# Patient Record
Sex: Female | Born: 1960 | Race: Black or African American | Hispanic: No | Marital: Single | Smoking: Never smoker
Health system: Southern US, Community
[De-identification: ages and names within clinical notes are randomized; demographics above are authoritative.]

## PROBLEM LIST (undated history)

## (undated) DIAGNOSIS — I10 Essential (primary) hypertension: Secondary | ICD-10-CM

## (undated) DIAGNOSIS — IMO0002 Reserved for concepts with insufficient information to code with codable children: Secondary | ICD-10-CM

## (undated) DIAGNOSIS — E785 Hyperlipidemia, unspecified: Secondary | ICD-10-CM

## (undated) HISTORY — PX: LIPOMA EXCISION: SHX5283

## (undated) HISTORY — DX: Reserved for concepts with insufficient information to code with codable children: IMO0002

## (undated) HISTORY — DX: Hyperlipidemia, unspecified: E78.5

## (undated) HISTORY — DX: Essential (primary) hypertension: I10

## (undated) HISTORY — PX: OTHER SURGICAL HISTORY: SHX169

---

## 1998-02-12 ENCOUNTER — Emergency Department (HOSPITAL_COMMUNITY): Admission: EM | Admit: 1998-02-12 | Discharge: 1998-02-12 | Payer: Self-pay | Admitting: Emergency Medicine

## 1998-03-13 ENCOUNTER — Emergency Department (HOSPITAL_COMMUNITY): Admission: EM | Admit: 1998-03-13 | Discharge: 1998-03-13 | Payer: Self-pay | Admitting: Emergency Medicine

## 1998-10-23 HISTORY — PX: ABDOMINAL HYSTERECTOMY: SHX81

## 2010-11-14 ENCOUNTER — Encounter
Admission: RE | Admit: 2010-11-14 | Discharge: 2010-11-22 | Payer: Self-pay | Source: Home / Self Care | Attending: Orthopedic Surgery | Admitting: Orthopedic Surgery

## 2010-11-24 ENCOUNTER — Ambulatory Visit: Payer: Worker's Compensation | Attending: Orthopedic Surgery | Admitting: Physical Therapy

## 2010-11-24 DIAGNOSIS — M2569 Stiffness of other specified joint, not elsewhere classified: Secondary | ICD-10-CM | POA: Insufficient documentation

## 2010-11-24 DIAGNOSIS — M25519 Pain in unspecified shoulder: Secondary | ICD-10-CM | POA: Insufficient documentation

## 2010-11-24 DIAGNOSIS — M542 Cervicalgia: Secondary | ICD-10-CM | POA: Insufficient documentation

## 2010-11-24 DIAGNOSIS — IMO0001 Reserved for inherently not codable concepts without codable children: Secondary | ICD-10-CM | POA: Insufficient documentation

## 2010-11-29 ENCOUNTER — Ambulatory Visit: Payer: Worker's Compensation | Admitting: Physical Therapy

## 2010-12-01 ENCOUNTER — Ambulatory Visit: Payer: Worker's Compensation | Admitting: Physical Therapy

## 2010-12-05 ENCOUNTER — Ambulatory Visit: Payer: Worker's Compensation | Attending: Orthopedic Surgery | Admitting: Physical Therapy

## 2010-12-05 DIAGNOSIS — M542 Cervicalgia: Secondary | ICD-10-CM | POA: Insufficient documentation

## 2010-12-05 DIAGNOSIS — M2569 Stiffness of other specified joint, not elsewhere classified: Secondary | ICD-10-CM | POA: Insufficient documentation

## 2010-12-05 DIAGNOSIS — M25519 Pain in unspecified shoulder: Secondary | ICD-10-CM | POA: Insufficient documentation

## 2010-12-05 DIAGNOSIS — IMO0001 Reserved for inherently not codable concepts without codable children: Secondary | ICD-10-CM | POA: Insufficient documentation

## 2010-12-08 ENCOUNTER — Ambulatory Visit: Payer: Worker's Compensation | Admitting: Physical Therapy

## 2010-12-13 ENCOUNTER — Ambulatory Visit: Payer: Worker's Compensation | Admitting: Physical Therapy

## 2010-12-15 ENCOUNTER — Ambulatory Visit: Payer: Worker's Compensation | Admitting: Physical Therapy

## 2010-12-20 ENCOUNTER — Ambulatory Visit: Payer: Worker's Compensation | Admitting: Physical Therapy

## 2010-12-22 ENCOUNTER — Ambulatory Visit: Payer: Worker's Compensation | Attending: Orthopedic Surgery | Admitting: Physical Therapy

## 2010-12-22 DIAGNOSIS — M2569 Stiffness of other specified joint, not elsewhere classified: Secondary | ICD-10-CM | POA: Insufficient documentation

## 2010-12-22 DIAGNOSIS — IMO0001 Reserved for inherently not codable concepts without codable children: Secondary | ICD-10-CM | POA: Insufficient documentation

## 2010-12-22 DIAGNOSIS — M25519 Pain in unspecified shoulder: Secondary | ICD-10-CM | POA: Insufficient documentation

## 2010-12-22 DIAGNOSIS — M542 Cervicalgia: Secondary | ICD-10-CM | POA: Insufficient documentation

## 2010-12-28 ENCOUNTER — Ambulatory Visit: Payer: Worker's Compensation | Admitting: Physical Therapy

## 2010-12-30 ENCOUNTER — Ambulatory Visit: Payer: Worker's Compensation | Admitting: Physical Therapy

## 2011-01-03 ENCOUNTER — Ambulatory Visit: Payer: Worker's Compensation | Admitting: Physical Therapy

## 2011-01-05 ENCOUNTER — Ambulatory Visit: Payer: Worker's Compensation | Admitting: Physical Therapy

## 2011-01-09 ENCOUNTER — Ambulatory Visit: Payer: Worker's Compensation | Admitting: Physical Therapy

## 2011-01-12 ENCOUNTER — Ambulatory Visit: Payer: Worker's Compensation | Admitting: Physical Therapy

## 2011-04-17 ENCOUNTER — Encounter (INDEPENDENT_AMBULATORY_CARE_PROVIDER_SITE_OTHER): Payer: Self-pay | Admitting: General Surgery

## 2011-04-17 ENCOUNTER — Ambulatory Visit (INDEPENDENT_AMBULATORY_CARE_PROVIDER_SITE_OTHER): Payer: Managed Care, Other (non HMO) | Admitting: General Surgery

## 2011-04-17 VITALS — BP 116/78 | HR 86 | Temp 96.6°F | Ht 64.0 in | Wt 180.8 lb

## 2011-04-17 DIAGNOSIS — M259 Joint disorder, unspecified: Secondary | ICD-10-CM

## 2011-04-17 DIAGNOSIS — R2230 Localized swelling, mass and lump, unspecified upper limb: Secondary | ICD-10-CM

## 2011-04-17 NOTE — Assessment & Plan Note (Signed)
Remove in OR.  Discussed surgery with patient including risks of bleeding, infection, damage to other structures.  I advised her that I would leave a drain and put her in a shoulder sling for 2 weeks.  She would at least be out of work for 2 weeks or whenever drain is removed, whichever is later.  She was advised that the scar would be 10-14 cm long and obliquely oriented.  I discussed packing of wound in case of infection.

## 2011-04-17 NOTE — Progress Notes (Signed)
Subjective:     Patient ID: Shirley Hubbard, female   DOB: September 02, 1961, 50 y.o.   MRN: [id number]    BP 116/78  Pulse 86  Temp 96.6 F (35.9 C)  Ht 5\' 4"  (1.626 m)  Wt 180 lb 12.8 oz (82.01 kg)  BMI 31.03 kg/m2    HPI Shirley Hubbard is a 50 y/o F with a 4-5 year history of a L shoulder mass.  She says it started very small and has gradually been getting larger.  It is now the size of a large orange.  It does sometimes become painful, around 3-4/10.  She does not require analgesics.  It has become red in the past, but has not ever drained or developed a sore.    Review of Systems  All other systems reviewed and are negative.   Past Medical History  Diagnosis Date  . Hypertension     on medication but cant remember the name   Past Surgical History  Procedure Date  . Abdominal hysterectomy 2000   No outpatient encounter prescriptions on file as of 04/17/2011.   No Known Allergies  History   Social History  . Marital Status: Married    Spouse Name: N/A    Number of Children: N/A  . Years of Education: N/A   Occupational History  . Not on file.   Social History Main Topics  . Smoking status: Never Smoker   . Smokeless tobacco: Never Used  . Alcohol Use: No  . Drug Use: No  . Sexually Active: Not on file   Other Topics Concern  . Not on file   Social History Narrative  . No narrative on file     Objective:   Physical Exam  Nursing note and vitals reviewed. Constitutional: She is oriented to person, place, and time. She appears well-developed and well-nourished. No distress.  HENT:  Head: Normocephalic and atraumatic.  Mouth/Throat: Oropharynx is clear and moist.  Eyes: Conjunctivae and EOM are normal. Pupils are equal, round, and reactive to light. No scleral icterus.  Neck: Normal range of motion. Neck supple. No tracheal deviation present. No thyromegaly present.  Cardiovascular: Normal rate, regular rhythm and normal heart sounds.  Exam reveals no gallop and  no friction rub.   No murmur heard. Pulmonary/Chest: Effort normal and breath sounds normal. No stridor. No respiratory distress. She has no wheezes. She has no rales.   She exhibits no tenderness.       Soft mobile mass with telangiectasias in center, 12 cm  Abdominal: Soft. Bowel sounds are normal. She exhibits no distension and no mass. There is no tenderness. There is no rebound and no guarding.  Musculoskeletal: Normal range of motion. She exhibits no edema and no tenderness.  Lymphadenopathy:    She has no cervical adenopathy.  Neurological: She is alert and oriented to person, place, and time. Coordination normal.  Skin: Skin is warm and dry. No rash noted. No erythema. No pallor.  Psychiatric: She has a normal mood and affect. Her behavior is normal. Judgment and thought content normal.       Assessment:       Plan:

## 2011-05-05 ENCOUNTER — Encounter (HOSPITAL_BASED_OUTPATIENT_CLINIC_OR_DEPARTMENT_OTHER)
Admission: RE | Admit: 2011-05-05 | Discharge: 2011-05-05 | Disposition: A | Discharge status: Home / Self Care | Payer: Managed Care, Other (non HMO) | Source: Ambulatory Visit | Attending: General Surgery | Admitting: General Surgery

## 2011-05-05 LAB — BASIC METABOLIC PANEL WITH GFR
BUN: 18 mg/dL (ref 6–23)
CO2: 29 meq/L (ref 19–32)
Calcium: 9.5 mg/dL (ref 8.4–10.5)
Chloride: 105 meq/L (ref 96–112)
Creatinine, Ser: 0.74 mg/dL (ref 0.50–1.10)
GFR calc Af Amer: >60 mL/min
GFR calc non Af Amer: >60 mL/min
Glucose, Bld: 97 mg/dL (ref 70–99)
Potassium: 3.8 meq/L (ref 3.5–5.1)
Sodium: 141 meq/L (ref 135–145)

## 2011-05-08 ENCOUNTER — Other Ambulatory Visit (INDEPENDENT_AMBULATORY_CARE_PROVIDER_SITE_OTHER): Payer: Self-pay | Admitting: General Surgery

## 2011-05-08 ENCOUNTER — Ambulatory Visit (HOSPITAL_BASED_OUTPATIENT_CLINIC_OR_DEPARTMENT_OTHER)
Admission: RE | Admit: 2011-05-08 | Discharge: 2011-05-08 | Disposition: A | Discharge status: Home / Self Care | Payer: Managed Care, Other (non HMO) | Source: Ambulatory Visit | Attending: General Surgery | Admitting: General Surgery

## 2011-05-08 DIAGNOSIS — Z01812 Encounter for preprocedural laboratory examination: Secondary | ICD-10-CM | POA: Insufficient documentation

## 2011-05-08 DIAGNOSIS — D1739 Benign lipomatous neoplasm of skin and subcutaneous tissue of other sites: Secondary | ICD-10-CM

## 2011-05-08 DIAGNOSIS — Z01810 Encounter for preprocedural cardiovascular examination: Secondary | ICD-10-CM | POA: Insufficient documentation

## 2011-05-08 LAB — POCT HEMOGLOBIN-HEMACUE: Hemoglobin: 11 g/dL — ABNORMAL LOW (ref 12.0–15.0)

## 2011-05-10 NOTE — Op Note (Signed)
  NAMEDanton Hubbard               ACCOUNT NO.:  [id number]  MEDICAL RECORD NO.:  [id number]  LOCATION:                                 FACILITY:  PHYSICIAN:  Almond Lint, MD       DATE OF BIRTH:  [birth year]  DATE OF PROCEDURE:  05/08/2011 DATE OF DISCHARGE:                              OPERATIVE REPORT   PREOPERATIVE DIAGNOSIS:  Left back mass.  POSTOPERATIVE DIAGNOSIS:  Left back mass.  PROCEDURE:  Excision of the left back mass, 8 x 13 cm.  SURGEON:  Almond Lint, MD  ASSISTANT:  None.  ANESTHESIA:  General and local.  FINDINGS:  Fatty back mass, 8 x 13 cm.  SPECIMEN:  Left back mass to Pathology.  ESTIMATED BLOOD LOSS:  Minimal.  COMPLICATIONS:  None known.  PROCEDURE:  [name] was identified in the holding area and taken to operating room where she was placed supine on the operating room table. General anesthesia was induced.  She was placed into the right lateral decubitus position and her left back was prepped and draped in sterile fashion.  Time-out was performed according to surgical safety check list.  When all was correct, we continued.  The skin was marked over the mass and this was infiltrated with local anesthetic.  The skin was incised with #10 blade.  Skin hooks were used to elevate both the superior and inferior borders while skin flaps were created around the fatty mass.  This was quite large and did extend down to the fascia. Once the skin flaps have been created to the bottom of the mass, this was reflected off the muscle with the cautery.  The mass was then passed off the table.  It measured 8 x 13 cm.  The skin was noted to be redundant, and the skin flaps right over the top of the mass were quite thin because of how close the mass was to the skin.  The excess skin was excised.  An 22 Blake drain was placed at the lateral aspect and secured with 2-0 nylon.  The area was irrigated and hemostasis was achieved with the cautery.  The skin was  then reapproximated with 3-0 Vicryl deep dermal sutures and 4-0 Monocryl running subcuticular sutures.  This was cleaned, dried, and dressed with Benzoin, Steri-Strips gauze, and Tegaderm.  The patient was awakened from anesthesia and taken to PACU in stable condition.  Needle, sponge, instrument counts were correct.     Almond Lint, MD     FB/MEDQ  D:  05/08/2011  T:  05/08/2011  Job:  [id number]  Electronically Signed by Almond Lint MD on 05/10/2011 02:21:56 PM

## 2011-05-12 ENCOUNTER — Ambulatory Visit (INDEPENDENT_AMBULATORY_CARE_PROVIDER_SITE_OTHER): Payer: Commercial Indemnity | Admitting: General Surgery

## 2011-05-12 ENCOUNTER — Encounter (INDEPENDENT_AMBULATORY_CARE_PROVIDER_SITE_OTHER): Payer: Self-pay | Admitting: General Surgery

## 2011-05-12 VITALS — Temp 97.1°F

## 2011-05-12 DIAGNOSIS — D1779 Benign lipomatous neoplasm of other sites: Secondary | ICD-10-CM

## 2011-05-12 DIAGNOSIS — D171 Benign lipomatous neoplasm of skin and subcutaneous tissue of trunk: Secondary | ICD-10-CM

## 2011-05-12 NOTE — Progress Notes (Signed)
Subjective:     Patient ID: Shirley Hubbard, female   DOB: 08/06/1961, 50 y.o.   MRN: [id number]  HPI Doing Ok after lipoma removal. She is taking around 6 pain pills per day.  They do cause itching and some nausea.  She has been taking it easy.  She denies fevers/chills.  Review of Systems Otherwise negative.    Objective:   Physical Exam Surgical site is without swelling, erythema, or drainage.  Drain is serosanguinous.  Output has been around 20 mL/day.  This is pulled.    Assessment:     L back lipoma    Plan:        Follow up in 2 weeks.

## 2011-05-16 ENCOUNTER — Ambulatory Visit (INDEPENDENT_AMBULATORY_CARE_PROVIDER_SITE_OTHER): Payer: Commercial Indemnity | Admitting: General Surgery

## 2011-05-16 ENCOUNTER — Emergency Department (HOSPITAL_COMMUNITY)
Admission: EM | Admit: 2011-05-16 | Discharge: 2011-05-16 | Disposition: A | Discharge status: Home / Self Care | Payer: Managed Care, Other (non HMO) | Attending: Emergency Medicine | Admitting: Emergency Medicine

## 2011-05-16 ENCOUNTER — Other Ambulatory Visit (INDEPENDENT_AMBULATORY_CARE_PROVIDER_SITE_OTHER): Payer: Self-pay

## 2011-05-16 DIAGNOSIS — T148XXA Other injury of unspecified body region, initial encounter: Secondary | ICD-10-CM

## 2011-05-16 DIAGNOSIS — Z0389 Encounter for observation for other suspected diseases and conditions ruled out: Secondary | ICD-10-CM | POA: Insufficient documentation

## 2011-05-16 DIAGNOSIS — IMO0002 Reserved for concepts with insufficient information to code with codable children: Secondary | ICD-10-CM

## 2011-05-16 MED ORDER — OXYCODONE-ACETAMINOPHEN 7.5-325 MG PO TABS: 1.0000 | ORAL_TABLET | ORAL | Status: DC | PRN

## 2011-05-16 NOTE — Patient Instructions (Signed)
Ice to area for 48 hours then heat.  Use arm sling for comfort.

## 2011-05-16 NOTE — Progress Notes (Signed)
She had her drain removed from her back 4 days ago by Dr. Donell Beers. She will go this morning with severe pain swelling and bruising in the left upper back area around the incision. She's having difficulty moving her left arm due to the pain.  No fever or chills.  Exam: The upper back incision is intact. There is surrounding ecchymosis and firm swelling present. Sterile needle aspiration was performed and dark thick blood was evacuated-a small amount. Ultrasound was performed of the area-heterogeneous fluid collections were noted.  Assessment: Postoperative hematoma.  Plan: Ice to area for 48 hours then start heat. Use arm sling for comfort. Avoid aspirin or nonsteroidal medications. Will refill her Percocet. Keep her return visit with Dr. Donell Beers.

## 2011-05-26 ENCOUNTER — Ambulatory Visit (INDEPENDENT_AMBULATORY_CARE_PROVIDER_SITE_OTHER): Payer: Commercial Indemnity | Admitting: General Surgery

## 2011-05-26 ENCOUNTER — Encounter (INDEPENDENT_AMBULATORY_CARE_PROVIDER_SITE_OTHER): Payer: Self-pay | Admitting: General Surgery

## 2011-05-26 VITALS — HR 68 | Temp 96.0°F

## 2011-05-26 DIAGNOSIS — IMO0002 Reserved for concepts with insufficient information to code with codable children: Secondary | ICD-10-CM

## 2011-05-26 DIAGNOSIS — T148XXA Other injury of unspecified body region, initial encounter: Secondary | ICD-10-CM

## 2011-05-26 MED ORDER — OXYCODONE-ACETAMINOPHEN 7.5-325 MG PO TABS: 1.0000 | ORAL_TABLET | ORAL | Status: DC | PRN

## 2011-05-26 NOTE — Progress Notes (Signed)
Shirley Hubbard is a 50 y.o. female.    Chief Complaint  Patient presents with  . Other    PO 2 wk reck drain    HPI HPI [name] continues to have 7/10 pain.  She is experiencing significant swelling and some limitation of L arm movement.  She is feeling some better since last evaluation.    Past Medical History  Diagnosis Date  . Hypertension     on medication but cant remember the name  . Lipoma     Past Surgical History  Procedure Date  . Abdominal hysterectomy 2000  . Lipoma excision     back    History reviewed. No pertinent family history.  Social History History  Substance Use Topics  . Smoking status: Never Smoker   . Smokeless tobacco: Never Used  . Alcohol Use: No    No Known Allergies  Current Outpatient Prescriptions  Medication Sig Dispense Refill  . lisinopril-hydrochlorothiazide (PRINZIDE,ZESTORETIC) 20-12.5 MG per tablet Take 1 tablet by mouth daily.        Marland Kitchen oxyCODONE-acetaminophen (PERCOCET) 7.5-325 MG per tablet Take 1 tablet by mouth every 4 (four) hours as needed for pain.  30 tablet  0  . PREMARIN 0.3 MG tablet Daily.        Review of Systems ROS  Physical Exam Physical Exam  Constitutional: She is oriented to person, place, and time. She appears well-developed and well-nourished. No distress.  HENT:  Head: Normocephalic and atraumatic.  Eyes: Conjunctivae and EOM are normal. Pupils are equal, round, and reactive to light.  Respiratory: Effort normal and breath sounds normal.         hematoma  GI: Soft.  Musculoskeletal: She exhibits no edema and no tenderness.       Decreased ROM L arm  Neurological: She is alert and oriented to person, place, and time. Coordination normal.  Skin: Skin is warm and dry. She is not diaphoretic.  Psychiatric: She has a normal mood and affect. Her behavior is normal. Judgment and thought content normal.     Pulse 68, temperature 96 F (35.6 C).  Assessment/Plan  Lipoma, s/p  excision.  Hematoma - postoperative Aspirated part of the hematoma for pain reasons Not all would aspirate. Advised that these usually resolve on their own.  Follow up in 2 weeks.    Refill oxycodone.   Constantinos Krempasky 05/26/2011, 10:46 AM

## 2011-05-26 NOTE — Assessment & Plan Note (Signed)
Aspirated part of the hematoma for pain reasons Not all would aspirate. Advised that these usually resolve on their own.  Follow up in 2 weeks.

## 2011-06-08 ENCOUNTER — Encounter (INDEPENDENT_AMBULATORY_CARE_PROVIDER_SITE_OTHER): Payer: Self-pay | Admitting: General Surgery

## 2011-06-09 ENCOUNTER — Encounter (INDEPENDENT_AMBULATORY_CARE_PROVIDER_SITE_OTHER): Payer: Self-pay | Admitting: General Surgery

## 2011-06-09 ENCOUNTER — Ambulatory Visit (INDEPENDENT_AMBULATORY_CARE_PROVIDER_SITE_OTHER): Payer: Commercial Indemnity | Admitting: General Surgery

## 2011-06-09 VITALS — Temp 96.8°F

## 2011-06-09 DIAGNOSIS — T148XXA Other injury of unspecified body region, initial encounter: Secondary | ICD-10-CM

## 2011-06-09 DIAGNOSIS — IMO0002 Reserved for concepts with insufficient information to code with codable children: Secondary | ICD-10-CM

## 2011-06-09 MED ORDER — OXYCODONE-ACETAMINOPHEN 7.5-325 MG PO TABS: 1.0000 | ORAL_TABLET | ORAL | Status: DC | PRN

## 2011-06-09 NOTE — Assessment & Plan Note (Signed)
Hematoma improving, still present, shoulder still tender.   Range of motion improved, but not back to normal. Physical therapy consult.   Refill narcotics.

## 2011-06-09 NOTE — Progress Notes (Signed)
HPI: Doing better from pain standpoint with Left shoulder hematoma after large lipoma removal. No fevers/chills.  Sleeping better.    Exam: L shoulder incision well healed.  Hematoma smaller than last visit. Still sore.  No sign of cellulitis.  Arm abduction to 90 degrees.  Hematoma - postoperative Hematoma improving, still present, shoulder still tender.   Range of motion improved, but not back to normal. Physical therapy consult.   Refill narcotics.

## 2011-06-29 ENCOUNTER — Telehealth (INDEPENDENT_AMBULATORY_CARE_PROVIDER_SITE_OTHER): Payer: Self-pay | Admitting: General Surgery

## 2011-06-29 ENCOUNTER — Ambulatory Visit: Payer: Managed Care, Other (non HMO) | Attending: General Surgery

## 2011-06-29 ENCOUNTER — Ambulatory Visit: Payer: Managed Care, Other (non HMO)

## 2011-06-29 DIAGNOSIS — IMO0001 Reserved for inherently not codable concepts without codable children: Secondary | ICD-10-CM | POA: Insufficient documentation

## 2011-06-29 DIAGNOSIS — M25519 Pain in unspecified shoulder: Secondary | ICD-10-CM | POA: Insufficient documentation

## 2011-06-29 DIAGNOSIS — M25619 Stiffness of unspecified shoulder, not elsewhere classified: Secondary | ICD-10-CM | POA: Insufficient documentation

## 2011-06-29 NOTE — Telephone Encounter (Signed)
The patient contacted the office insisting on a refill for Percocet 7.5-325 MG. Is this medication okay to refill.

## 2011-06-29 NOTE — Telephone Encounter (Signed)
Yes  X 40 pills no refills

## 2011-07-04 ENCOUNTER — Telehealth (INDEPENDENT_AMBULATORY_CARE_PROVIDER_SITE_OTHER): Payer: Self-pay

## 2011-07-04 NOTE — Telephone Encounter (Signed)
Called pt to inquire if physical therapy appt had been scheduled for her yet.

## 2011-07-06 ENCOUNTER — Ambulatory Visit: Payer: Managed Care, Other (non HMO) | Admitting: Physical Therapy

## 2011-07-07 ENCOUNTER — Telehealth (INDEPENDENT_AMBULATORY_CARE_PROVIDER_SITE_OTHER): Payer: Self-pay

## 2011-07-07 NOTE — Telephone Encounter (Signed)
Pt called to let me know she has been scheduled for her physical therapy and has an appointment today for her second session.

## 2011-07-11 ENCOUNTER — Ambulatory Visit: Payer: Managed Care, Other (non HMO) | Admitting: Physical Therapy

## 2011-07-14 ENCOUNTER — Encounter (INDEPENDENT_AMBULATORY_CARE_PROVIDER_SITE_OTHER): Payer: Self-pay

## 2011-07-14 ENCOUNTER — Other Ambulatory Visit (INDEPENDENT_AMBULATORY_CARE_PROVIDER_SITE_OTHER): Payer: Self-pay | Admitting: General Surgery

## 2011-07-14 ENCOUNTER — Encounter (INDEPENDENT_AMBULATORY_CARE_PROVIDER_SITE_OTHER): Payer: Commercial Indemnity | Admitting: General Surgery

## 2011-07-14 ENCOUNTER — Ambulatory Visit: Payer: Managed Care, Other (non HMO) | Admitting: Physical Therapy

## 2011-07-14 DIAGNOSIS — T148XXA Other injury of unspecified body region, initial encounter: Secondary | ICD-10-CM

## 2011-07-14 DIAGNOSIS — D171 Benign lipomatous neoplasm of skin and subcutaneous tissue of trunk: Secondary | ICD-10-CM

## 2011-07-14 MED ORDER — OXYCODONE-ACETAMINOPHEN 7.5-325 MG PO TABS: 1.0000 | ORAL_TABLET | ORAL | Status: DC | PRN

## 2011-07-18 ENCOUNTER — Ambulatory Visit: Payer: Managed Care, Other (non HMO) | Admitting: Physical Therapy

## 2011-07-20 ENCOUNTER — Ambulatory Visit: Payer: Managed Care, Other (non HMO) | Admitting: Physical Therapy

## 2011-07-25 ENCOUNTER — Ambulatory Visit: Payer: Managed Care, Other (non HMO) | Attending: General Surgery | Admitting: Physical Therapy

## 2011-07-25 DIAGNOSIS — M25519 Pain in unspecified shoulder: Secondary | ICD-10-CM | POA: Insufficient documentation

## 2011-07-25 DIAGNOSIS — IMO0001 Reserved for inherently not codable concepts without codable children: Secondary | ICD-10-CM | POA: Insufficient documentation

## 2011-07-25 DIAGNOSIS — M25619 Stiffness of unspecified shoulder, not elsewhere classified: Secondary | ICD-10-CM | POA: Insufficient documentation

## 2011-07-27 ENCOUNTER — Ambulatory Visit: Payer: Managed Care, Other (non HMO) | Admitting: Physical Therapy

## 2011-08-01 ENCOUNTER — Ambulatory Visit: Payer: Managed Care, Other (non HMO) | Admitting: Physical Therapy

## 2011-08-02 ENCOUNTER — Encounter (INDEPENDENT_AMBULATORY_CARE_PROVIDER_SITE_OTHER): Payer: Self-pay | Admitting: General Surgery

## 2011-08-02 ENCOUNTER — Ambulatory Visit (INDEPENDENT_AMBULATORY_CARE_PROVIDER_SITE_OTHER): Payer: Commercial Indemnity | Admitting: General Surgery

## 2011-08-02 VITALS — BP 132/82 | HR 74 | Temp 96.8°F | Resp 12 | Ht 65.0 in | Wt 193.0 lb

## 2011-08-02 DIAGNOSIS — IMO0002 Reserved for concepts with insufficient information to code with codable children: Secondary | ICD-10-CM

## 2011-08-02 NOTE — Progress Notes (Signed)
HISTORY: Pt is doing much better.  She has been going to physical therapy and has been doing exercises.  She is using much less pain medication.  She is able to do more of her daily activities than before.  She had a prior visit scheduled that we had to cancel in order to schedule an urgent surgery.  She is here for follow up.     PERTINENT REVIEW OF SYSTEMS: Otherwise negative.    EXAM: Head: Normocephalic and atraumatic.  Resp: No respiratory distress, normal effort. Back:  L scapular incision with near resolution of hematoma.  Incisional area is still tight.   MSK:  ROM significantly improved.    Neurological: Alert and oriented to person, place, and time. Coordination normal.  Skin: Skin is warm and dry. No rash noted. No diaphoretic. No erythema. No pallor.  Psychiatric: Normal mood and affect. Normal behavior. Judgment and thought content normal.    ASSESSMENT AND PLAN: Hematoma - postoperative Significant improvement On track to be back at work beginning of November.  Continue physical therapy.       Maudry Diego, MD Surgical Oncology, General & Endocrine Surgery [facility]  Raynelle Jan., MD Spry, Geroge Baseman., MD

## 2011-08-02 NOTE — Patient Instructions (Signed)
Continue physical therapy and exercises.

## 2011-08-02 NOTE — Assessment & Plan Note (Signed)
Significant improvement On track to be back at work beginning of November.  Continue physical therapy.

## 2011-08-03 ENCOUNTER — Ambulatory Visit: Payer: Managed Care, Other (non HMO) | Admitting: Physical Therapy

## 2011-08-08 ENCOUNTER — Ambulatory Visit: Payer: Managed Care, Other (non HMO) | Admitting: Physical Therapy

## 2011-08-10 ENCOUNTER — Ambulatory Visit: Payer: Managed Care, Other (non HMO) | Admitting: Physical Therapy

## 2011-08-22 ENCOUNTER — Encounter (INDEPENDENT_AMBULATORY_CARE_PROVIDER_SITE_OTHER): Payer: Self-pay | Admitting: General Surgery

## 2011-08-22 ENCOUNTER — Ambulatory Visit (INDEPENDENT_AMBULATORY_CARE_PROVIDER_SITE_OTHER): Payer: Commercial Indemnity | Admitting: General Surgery

## 2011-08-22 VITALS — BP 138/78 | HR 72 | Temp 97.5°F | Resp 16 | Ht 63.0 in | Wt 192.4 lb

## 2011-08-22 DIAGNOSIS — IMO0002 Reserved for concepts with insufficient information to code with codable children: Secondary | ICD-10-CM

## 2011-08-22 NOTE — Progress Notes (Signed)
HISTORY: The patient is now doing much better after her lipoma excision with postoperative hematoma. She's not needing any analgesics at all and has no restriction of motion.  She is now cleared to go back to work.  EXAM: Head: Normocephalic and atraumatic.  Eyes:  Conjunctivae are normal. Pupils are equal, round, and reactive to light. No scleral icterus.  Back:  Hematoma resolved.  Scar non tender.   Resp: No respiratory distress, normal effort. Neurological: Alert and oriented to person, place, and time. Coordination normal.  Skin: Skin is warm and dry. No rash noted. No diaphoretic. No erythema. No pallor.  Psychiatric: Normal mood and affect. Normal behavior. Judgment and thought content normal.   ASSESSMENT AND PLAN:   Hematoma - postoperative Resolved. Pt may go back to work. No analgesics required.   No restrictions.   Follow up as needed.         Maudry Diego, MD Surgical Oncology, General & Endocrine Surgery [facility]  Raynelle Jan., MD Spry, Geroge Baseman., MD

## 2011-08-22 NOTE — Assessment & Plan Note (Signed)
Resolved. Pt may go back to work. No analgesics required.   No restrictions.   Follow up as needed.

## 2011-08-22 NOTE — Patient Instructions (Signed)
May return to work without restrictions.

## 2015-05-28 DIAGNOSIS — L239 Allergic contact dermatitis, unspecified cause: Secondary | ICD-10-CM | POA: Insufficient documentation

## 2015-06-16 DIAGNOSIS — D500 Iron deficiency anemia secondary to blood loss (chronic): Secondary | ICD-10-CM | POA: Insufficient documentation

## 2015-06-16 DIAGNOSIS — G500 Trigeminal neuralgia: Secondary | ICD-10-CM | POA: Insufficient documentation

## 2015-09-14 DIAGNOSIS — I10 Essential (primary) hypertension: Secondary | ICD-10-CM | POA: Insufficient documentation

## 2015-11-16 DIAGNOSIS — F419 Anxiety disorder, unspecified: Secondary | ICD-10-CM | POA: Insufficient documentation

## 2016-02-20 DIAGNOSIS — G894 Chronic pain syndrome: Secondary | ICD-10-CM | POA: Insufficient documentation

## 2016-07-27 DIAGNOSIS — E041 Nontoxic single thyroid nodule: Secondary | ICD-10-CM | POA: Insufficient documentation

## 2016-08-22 DIAGNOSIS — M47812 Spondylosis without myelopathy or radiculopathy, cervical region: Secondary | ICD-10-CM | POA: Insufficient documentation

## 2016-08-22 DIAGNOSIS — M545 Low back pain, unspecified: Secondary | ICD-10-CM | POA: Insufficient documentation

## 2016-09-28 DIAGNOSIS — R7301 Impaired fasting glucose: Secondary | ICD-10-CM | POA: Insufficient documentation

## 2016-09-28 DIAGNOSIS — L603 Nail dystrophy: Secondary | ICD-10-CM | POA: Insufficient documentation

## 2016-09-28 DIAGNOSIS — E782 Mixed hyperlipidemia: Secondary | ICD-10-CM | POA: Insufficient documentation

## 2017-08-24 DIAGNOSIS — R0683 Snoring: Secondary | ICD-10-CM | POA: Insufficient documentation

## 2017-12-05 DIAGNOSIS — R29818 Other symptoms and signs involving the nervous system: Secondary | ICD-10-CM | POA: Insufficient documentation

## 2019-03-12 DIAGNOSIS — R195 Other fecal abnormalities: Secondary | ICD-10-CM | POA: Insufficient documentation

## 2019-04-09 DIAGNOSIS — R319 Hematuria, unspecified: Secondary | ICD-10-CM | POA: Insufficient documentation

## 2019-04-09 DIAGNOSIS — R519 Headache, unspecified: Secondary | ICD-10-CM | POA: Insufficient documentation

## 2019-06-06 ENCOUNTER — Encounter (HOSPITAL_COMMUNITY): Payer: Self-pay | Admitting: Emergency Medicine

## 2019-06-06 ENCOUNTER — Other Ambulatory Visit: Payer: Self-pay

## 2019-06-06 ENCOUNTER — Emergency Department (HOSPITAL_COMMUNITY): Payer: Managed Care, Other (non HMO)

## 2019-06-06 ENCOUNTER — Emergency Department (HOSPITAL_COMMUNITY)
Admission: EM | Admit: 2019-06-06 | Discharge: 2019-06-06 | Disposition: A | Discharge status: Home / Self Care | Payer: Managed Care, Other (non HMO) | Attending: Emergency Medicine | Admitting: Emergency Medicine

## 2019-06-06 DIAGNOSIS — I10 Essential (primary) hypertension: Secondary | ICD-10-CM | POA: Insufficient documentation

## 2019-06-06 DIAGNOSIS — Z79899 Other long term (current) drug therapy: Secondary | ICD-10-CM | POA: Diagnosis not present

## 2019-06-06 DIAGNOSIS — U071 COVID-19: Secondary | ICD-10-CM | POA: Insufficient documentation

## 2019-06-06 DIAGNOSIS — R05 Cough: Secondary | ICD-10-CM | POA: Diagnosis present

## 2019-06-06 DIAGNOSIS — Z87891 Personal history of nicotine dependence: Secondary | ICD-10-CM | POA: Insufficient documentation

## 2019-06-06 DIAGNOSIS — Z20822 Contact with and (suspected) exposure to covid-19: Secondary | ICD-10-CM

## 2019-06-06 LAB — SARS CORONAVIRUS 2 BY RT PCR (HOSPITAL ORDER, PERFORMED IN ~~LOC~~ HOSPITAL LAB): SARS Coronavirus 2: POSITIVE — AB

## 2019-06-06 IMAGING — CR CHEST - 2 VIEW
2 series · 2 of 2 positions shown · non-contrast
Comparison: None.

CLINICAL DATA: Cough

EXAM:
CHEST - 2 VIEW

[w chest pa]
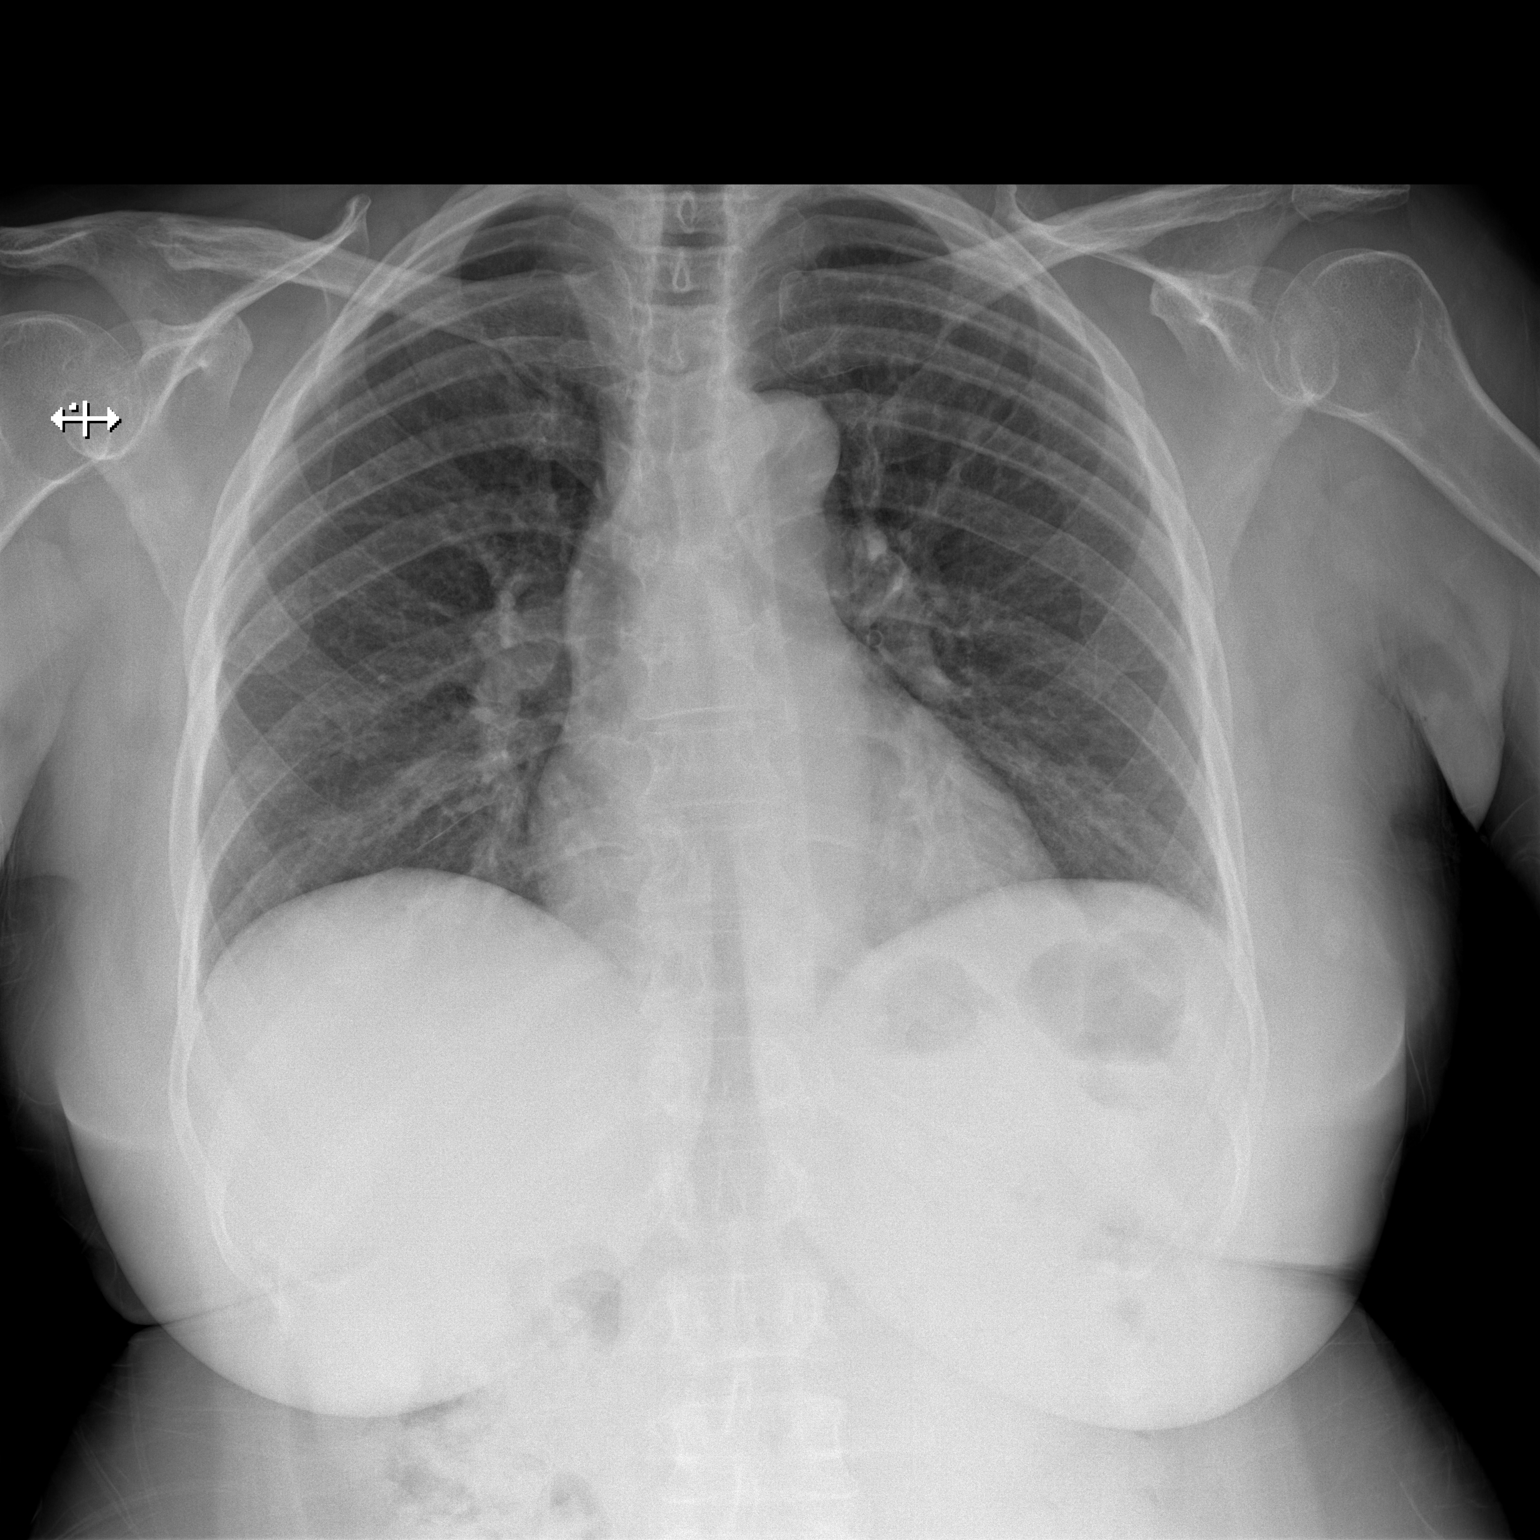

[w chest lat]
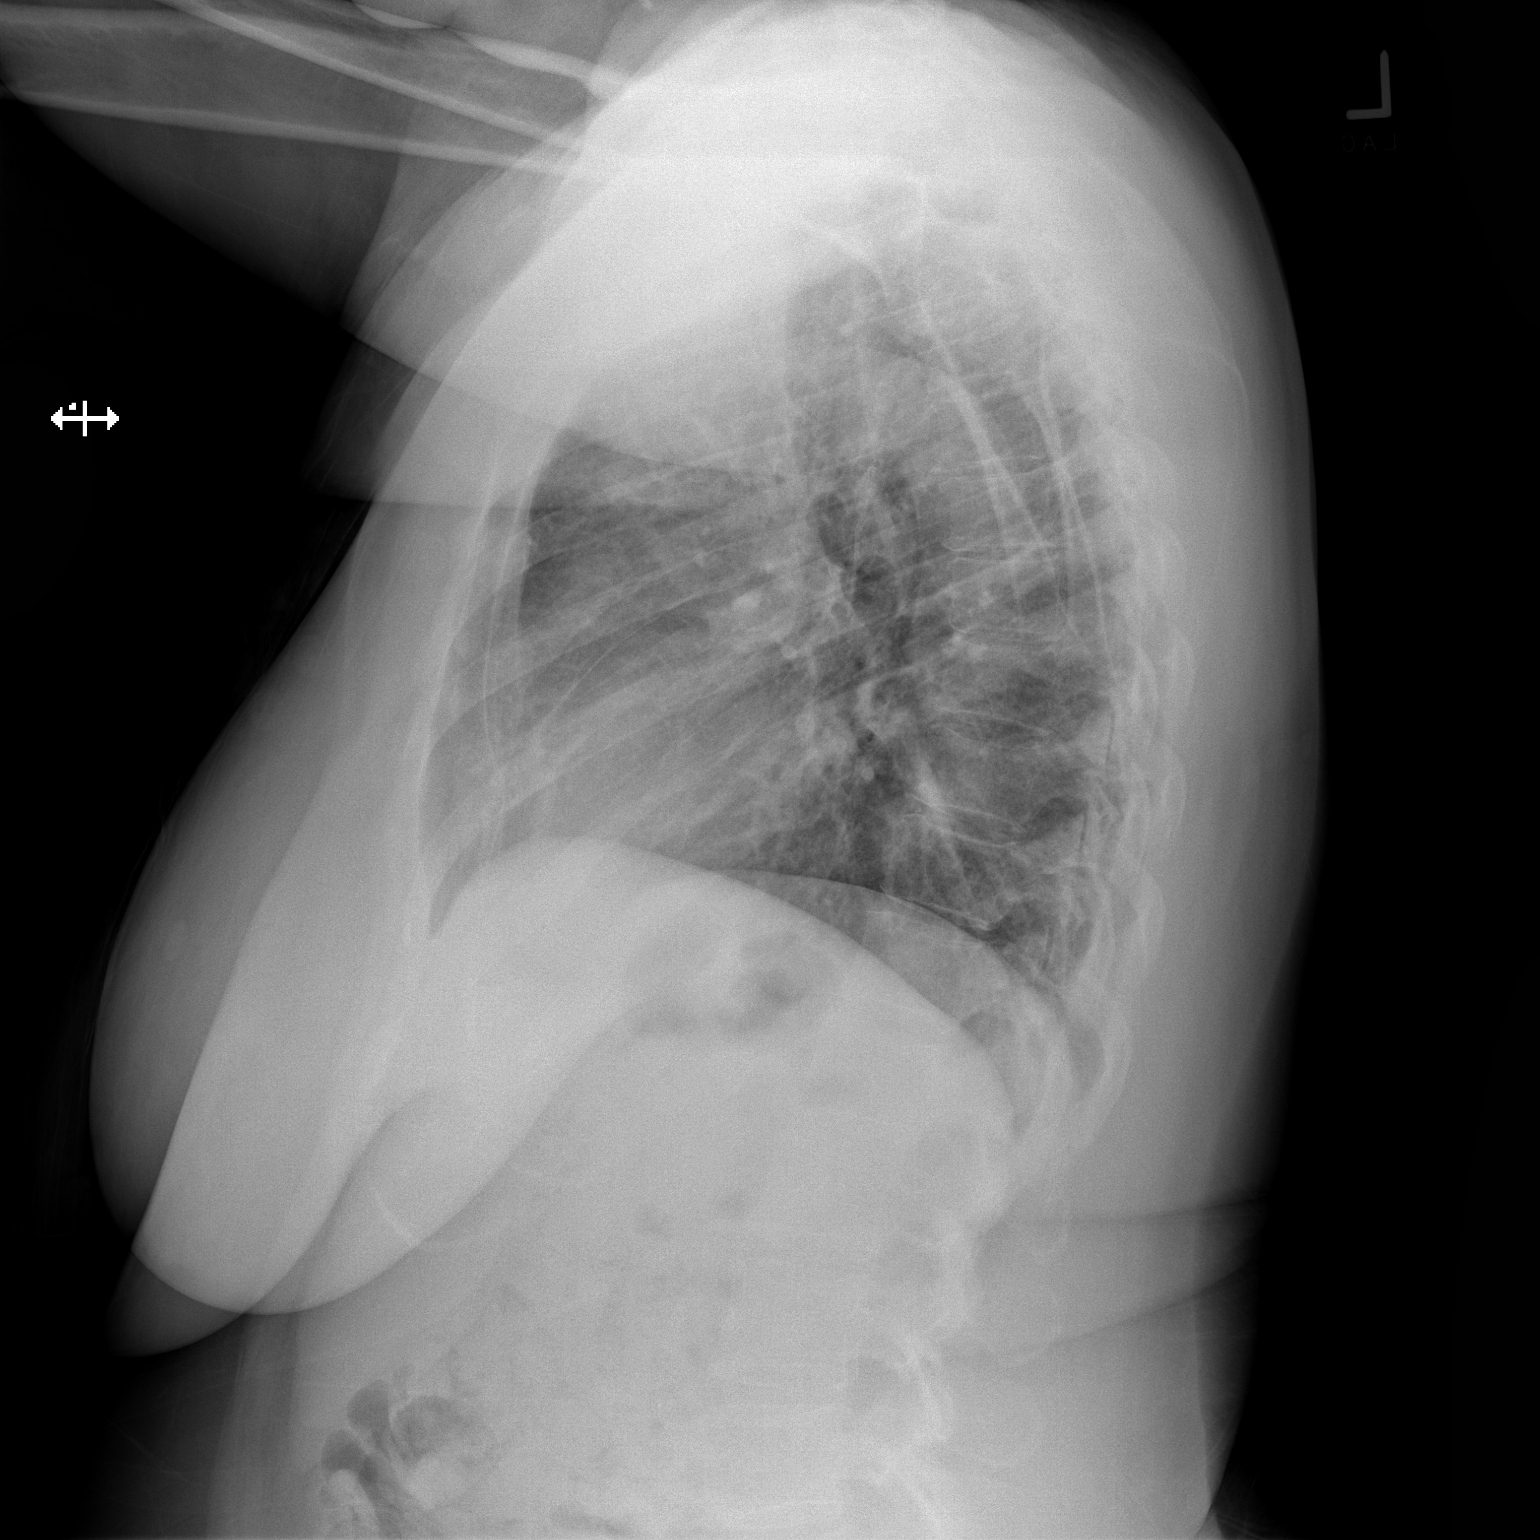

[2 of 2 positions shown; findings below may reference images not displayed]

FINDINGS: The heart size and mediastinal contours are within normal limits.
Both lungs are clear. The visualized skeletal structures are
unremarkable.
IMPRESSION: Negative chest.

## 2019-06-06 MED ORDER — ACETAMINOPHEN 500 MG PO TABS
1000.0000 mg | ORAL_TABLET | Freq: Once | ORAL | Status: AC
  Administered 2019-06-06: 1000 mg via ORAL
  Filled 2019-06-06: qty 2

## 2019-06-06 MED ORDER — PROMETHAZINE-DM 6.25-15 MG/5ML PO SYRP: 5.0000 mL | ORAL_SOLUTION | Freq: Four times a day (QID) | ORAL | 0 refills | Status: DC | PRN

## 2019-06-06 MED ORDER — ONDANSETRON 4 MG PO TBDP
4.0000 mg | ORAL_TABLET | Freq: Once | ORAL | Status: AC
  Administered 2019-06-06: 07:00:00 4 mg via ORAL
  Filled 2019-06-06: qty 1

## 2019-06-06 MED ORDER — ONDANSETRON 4 MG PO TBDP: 4.0000 mg | ORAL_TABLET | Freq: Three times a day (TID) | ORAL | 0 refills | Status: DC | PRN

## 2019-06-06 NOTE — ED Provider Notes (Signed)
[facility] DEPT Provider Note   CSN: [id number] Arrival date & time: 06/06/19  0432    History   Chief Complaint Chief Complaint  Patient presents with  . Cough  . Chills  . Generalized Body Aches    HPI Shirley Hubbard is a 58 y.o. female with a history of hypertension who presents to the emergency department with a chief complaint of nonproductive cough, body aches, fatigue, and chills that began over the last 24 hours.  She also reports that she developed nausea tonight.  She denies fever, shortness of breath, chest pain, sore throat, abdominal pain, vomiting, diarrhea, headache, dysuria, hematuria, vaginal discharge, dizziness, lightheadedness, or visual changes.  No treatment prior to arrival.  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  No sick contacts at home.  Shirley Hubbard was evaluated in Emergency Department on 06/06/2019 for the symptoms described in the history of present illness. She was evaluated in the context of the global COVID-19 pandemic, which necessitated consideration that the patient might be at risk for infection with the SARS-CoV-2 virus that causes COVID-19. Institutional protocols and algorithms that pertain to the evaluation of patients at risk for COVID-19 are in a state of rapid change based on information released by regulatory bodies including the CDC and federal and state organizations. These policies and algorithms were followed during the patient's care in the ED.      The history is provided by the patient. No language interpreter was used.    Past Medical History:  Diagnosis Date  . Hypertension    on medication but cant remember the name  . Lipoma   . Seroma    axillary    Patient Active Problem List   Diagnosis Date Noted  . Hematoma - postoperative 05/16/2011  . Mass of shoulder, L scapular area  04/17/2011    Past Surgical History:  Procedure Laterality Date  .  ABDOMINAL HYSTERECTOMY  2000  . LIPOMA EXCISION     back  . seroma aspiration       OB History   No obstetric history on file.      Home Medications    Prior to Admission medications   Medication Sig Start Date End Date Taking? Authorizing Provider  lisinopril-hydrochlorothiazide (PRINZIDE,ZESTORETIC) 20-12.5 MG per tablet Take 1 tablet by mouth daily.      [provider]  ondansetron (ZOFRAN ODT) 4 MG disintegrating tablet Take 1 tablet (4 mg total) by mouth every 8 (eight) hours as needed. 06/06/19   Shuna Tabor A, PA-C  oxyCODONE-acetaminophen (PERCOCET) 7.5-325 MG per tablet Ad lib. 06/30/11   [provider]  PREMARIN vaginal cream daily. 06/16/11   [provider]  promethazine-dextromethorphan (PROMETHAZINE-DM) 6.25-15 MG/5ML syrup Take 5 mLs by mouth 4 (four) times daily as needed for cough. 06/06/19   Matai Carpenito A, PA-C    Family History No family history on file.  Social History Social History   Tobacco Use  . Smoking status: Never Smoker  . Smokeless tobacco: Never Used  Substance Use Topics  . Alcohol use: No  . Drug use: No     Allergies   Patient has no known allergies.   Review of Systems Review of Systems  Constitutional: Positive for chills and fatigue. Negative for activity change and fever.  Respiratory: Positive for cough. Negative for shortness of breath and wheezing.   Cardiovascular: Negative for chest pain and palpitations.  Gastrointestinal: Positive for nausea. Negative  for abdominal pain, constipation, diarrhea and vomiting.  Genitourinary: Negative for dysuria, flank pain, frequency and vaginal discharge.  Musculoskeletal: Positive for myalgias. Negative for back pain, neck pain and neck stiffness.  Skin: Negative for rash.  Allergic/Immunologic: Negative for immunocompromised state.  Neurological: Negative for dizziness, weakness, numbness and headaches.  Psychiatric/Behavioral: Negative for confusion.      Physical Exam Updated Vital Signs BP 122/60   Pulse 82   Temp 99.8 F (37.7 C) (Oral)   Resp 16   Wt 87.1 kg   SpO2 93%   BMI 34.01 kg/m   Physical Exam Vitals signs and nursing note reviewed.  Constitutional:      General: She is not in acute distress.    Appearance: Normal appearance. She is not ill-appearing, toxic-appearing or diaphoretic.  HENT:     Head: Normocephalic.     Mouth/Throat:     Mouth: Mucous membranes are moist.  Eyes:     Extraocular Movements: Extraocular movements intact.     Conjunctiva/sclera: Conjunctivae normal.     Pupils: Pupils are equal, round, and reactive to light.  Neck:     Musculoskeletal: Normal range of motion and neck supple.  Cardiovascular:     Rate and Rhythm: Normal rate and regular rhythm.     Heart sounds: No murmur. No friction rub. No gallop.   Pulmonary:     Effort: Pulmonary effort is normal. No respiratory distress.     Breath sounds: No stridor. No wheezing, rhonchi or rales.     Comments: Lungs are clear to auscultation bilaterally without increased work of breathing. Chest:     Chest wall: No tenderness.  Abdominal:     General: There is no distension.     Palpations: Abdomen is soft. There is no mass.     Tenderness: There is no abdominal tenderness. There is no right CVA tenderness, left CVA tenderness, guarding or rebound.     Hernia: No hernia is present.  Skin:    General: Skin is warm.     Capillary Refill: Capillary refill takes less than 2 seconds.     Findings: No rash.  Neurological:     General: No focal deficit present.     Mental Status: She is alert.  Psychiatric:        Behavior: Behavior normal.      ED Treatments / Results  Labs (all labs ordered are listed, but only abnormal results are displayed) Labs Reviewed  SARS CORONAVIRUS 2 (HOSPITAL ORDER, [facility])    EKG None  Radiology Dg Chest 2 View  Result Date: 06/06/2019 CLINICAL DATA:  Cough EXAM:  CHEST - 2 VIEW COMPARISON:  None. FINDINGS: The heart size and mediastinal contours are within normal limits. Both lungs are clear. The visualized skeletal structures are unremarkable. IMPRESSION: Negative chest. Electronically Signed   By: Monte Fantasia M.D.   On: 06/06/2019 05:45    Procedures Procedures (including critical care time)  Medications Ordered in ED Medications  acetaminophen (TYLENOL) tablet 1,000 mg (1,000 mg Oral Given 06/06/19 0658)  ondansetron (ZOFRAN-ODT) disintegrating tablet 4 mg (4 mg Oral Given 06/06/19 1610)     Initial Impression / Assessment and Plan / ED Course  I have reviewed the triage vital signs and the nursing notes.  Pertinent labs & imaging results that were available during my care of the patient were reviewed by me and considered in my medical decision making (see chart for details).  58 year old female with a history of hypertension who presents to the emergency department with a chief complaint of nonproductive cough, body aches, fatigue, and chills that developed over the last 24 hours.  Her job involves working with the public, but she has no known specific COVID-19 contacts.  No shortness of breath or chest pain.  She is unsure if she had a fever, but did not take any medication prior to arrival and oral temp was 99.8 in the ER.  No tachypnea, hypoxia, and she is normotensive.  She has no other associated symptoms.  Chest x-ray is unremarkable.  Given associated symptoms, will order COVID-19 test.  At this time, since she is having no other symptoms, labs are not indicated.  She was given symptomatic treatment with Tylenol and Zofran in the ER.  Will discharge with the same and cough suppressant.  She is established with primary care for follow-up.  Home quarantine instructions given.  She is hemodynamically stable and in no acute distress.  All questions answered.  ER return precautions given.  At this time, she is safe for discharge to home  with outpatient follow-up as needed..  Final Clinical Impressions(s) / ED Diagnoses   Final diagnoses:  Suspected Covid-19 Virus Infection    ED Discharge Orders         Ordered    ondansetron (ZOFRAN ODT) 4 MG disintegrating tablet  Every 8 hours PRN     06/06/19 0710    promethazine-dextromethorphan (PROMETHAZINE-DM) 6.25-15 MG/5ML syrup  4 times daily PRN     06/06/19 0710           Lucretia Pendley A, PA-C 06/06/19 0845    Ward, Delice Bison, DO 06/06/19 2308

## 2019-06-06 NOTE — ED Triage Notes (Signed)
Patient here from home with complaints of chills, body aches, and cough that started yesterday. Worst today. Denies COVID exposure.

## 2019-06-06 NOTE — ED Notes (Signed)
Signature pad not working in room.  Pt verbal consent for D/C.

## 2019-06-06 NOTE — Discharge Instructions (Signed)
Thank you for allowing me to care for you today in the Emergency Department.   Take 650 mg of Tylenol or 600 mg of ibuprofen with food every 6 hours for pain.  You can alternate between these 2 medications every 3 hours if your pain returns.  For instance, you can take Tylenol at noon, followed by a dose of ibuprofen at 3, followed by second dose of Tylenol and 6.  This should help with body aches.  You can take 1 tablet of Zofran and dissolve in your tongue every 8 hours as needed for nausea or vomiting.  Take 5 mls of Promethazine DM every 6 hours as needed for cough.  You will receive a call from the hospital if your COVID test is positive.  Unfortunately, they are unable to call you if it is negative. You can download an app call MyChart using the activation code on your discharge paperwork to review the results once they are available.  Return to the emergency department if you develop severe shortness of breath, respiratory distress, if you pass out, if your fingers or lips turn blue, or if you develop persistent vomiting despite taking Zofran, or other new, concerning symptoms.

## 2019-06-10 ENCOUNTER — Telehealth (HOSPITAL_COMMUNITY): Payer: Self-pay

## 2019-06-19 ENCOUNTER — Telehealth (HOSPITAL_COMMUNITY): Payer: Self-pay

## 2019-06-25 ENCOUNTER — Other Ambulatory Visit: Payer: Self-pay

## 2019-06-25 ENCOUNTER — Emergency Department (HOSPITAL_COMMUNITY)
Admission: EM | Admit: 2019-06-25 | Discharge: 2019-06-25 | Disposition: A | Discharge status: Home / Self Care | Payer: Managed Care, Other (non HMO) | Attending: Emergency Medicine | Admitting: Emergency Medicine

## 2019-06-25 ENCOUNTER — Emergency Department (HOSPITAL_COMMUNITY): Payer: Managed Care, Other (non HMO)

## 2019-06-25 ENCOUNTER — Encounter (HOSPITAL_COMMUNITY): Payer: Self-pay

## 2019-06-25 DIAGNOSIS — Z79899 Other long term (current) drug therapy: Secondary | ICD-10-CM | POA: Insufficient documentation

## 2019-06-25 DIAGNOSIS — R079 Chest pain, unspecified: Secondary | ICD-10-CM

## 2019-06-25 DIAGNOSIS — U071 COVID-19: Secondary | ICD-10-CM | POA: Diagnosis present

## 2019-06-25 DIAGNOSIS — I10 Essential (primary) hypertension: Secondary | ICD-10-CM | POA: Diagnosis not present

## 2019-06-25 LAB — CBC
HCT: 39.7 % (ref 36.0–46.0)
Hemoglobin: 12.4 g/dL (ref 12.0–15.0)
MCH: 28.5 pg (ref 26.0–34.0)
MCHC: 31.2 g/dL (ref 30.0–36.0)
MCV: 91.3 fL (ref 80.0–100.0)
Platelets: 203 10*3/uL (ref 150–400)
RBC: 4.35 MIL/uL (ref 3.87–5.11)
RDW: 13.1 % (ref 11.5–15.5)
WBC: 5.7 10*3/uL (ref 4.0–10.5)
nRBC: 0 % (ref 0.0–0.2)

## 2019-06-25 LAB — BASIC METABOLIC PANEL
Anion gap: 11 (ref 5–15)
BUN: 20 mg/dL (ref 6–20)
CO2: 25 mmol/L (ref 22–32)
Calcium: 9.5 mg/dL (ref 8.9–10.3)
Chloride: 104 mmol/L (ref 98–111)
Creatinine, Ser: 0.77 mg/dL (ref 0.44–1.00)
GFR calc Af Amer: >60 mL/min (ref >60)
GFR calc non Af Amer: >60 mL/min (ref >60)
Glucose, Bld: 90 mg/dL (ref 70–99)
Potassium: 3.9 mmol/L (ref 3.5–5.1)
Sodium: 140 mmol/L (ref 135–145)

## 2019-06-25 LAB — TROPONIN I (HIGH SENSITIVITY): Troponin I (High Sensitivity): 2 ng/L (ref <18)

## 2019-06-25 MED ORDER — IBUPROFEN 400 MG PO TABS: 400.0000 mg | ORAL_TABLET | Freq: Four times a day (QID) | ORAL | 0 refills | Status: DC | PRN

## 2019-06-25 MED ORDER — SODIUM CHLORIDE 0.9% FLUSH: 3.0000 mL | Freq: Once | INTRAVENOUS | Status: DC

## 2019-06-25 NOTE — ED Notes (Signed)
Informed Dr. Roslynn Amble of delay because we are unable to get blood or IV informed Megan RN Charge Nurse that we need ultrasound IV for pt, no respiratory or acute distress noted alert and oriented x 3 able to speak in full sentences call light in reach.

## 2019-06-25 NOTE — ED Notes (Signed)
Placed an order for IV team to come and see if they can get IV and blood on pt.

## 2019-06-25 NOTE — ED Notes (Signed)
Attempted to stick pt for IV and blood unable to get specimen or IV pt tolerated well.

## 2019-06-25 NOTE — ED Provider Notes (Signed)
[facility] DEPT Provider Note   CSN: [id number] Arrival date & time: 06/25/19  0901     History   Chief Complaint Chief Complaint  Patient presents with  . Chest Pain    HPI Shirley Hubbard is a 58 y.o. female. Patient presents with chest pain past medical history significant for hyperlipidemia and hypertension that patient states is well treated.  States she was positive for COVID 2 weeks ago and has had chest pain that radiated into her left arm arm since that time.  Chest pain is midsternal, worse in the morning, does not change with exertion or position.  Patient denies nausea, chills, sweats, fever. Patient states that she presented to ED today because her doctor recommended she do so after she described her pain to him.        HPI  Past Medical History:  Diagnosis Date  . Hypertension    on medication but cant remember the name  . Lipoma   . Seroma    axillary    Patient Active Problem List   Diagnosis Date Noted  . Hematoma - postoperative 05/16/2011  . Mass of shoulder, L scapular area  04/17/2011    Past Surgical History:  Procedure Laterality Date  . ABDOMINAL HYSTERECTOMY  2000  . LIPOMA EXCISION     back  . seroma aspiration       OB History   No obstetric history on file.      Home Medications    Prior to Admission medications   Medication Sig Start Date End Date Taking? Authorizing Provider  lisinopril-hydrochlorothiazide (PRINZIDE,ZESTORETIC) 20-12.5 MG per tablet Take 1 tablet by mouth daily.      [provider]  ondansetron (ZOFRAN ODT) 4 MG disintegrating tablet Take 1 tablet (4 mg total) by mouth every 8 (eight) hours as needed. 06/06/19   McDonald, Mia A, PA-C  oxyCODONE-acetaminophen (PERCOCET) 7.5-325 MG per tablet Ad lib. 06/30/11   [provider]  PREMARIN vaginal cream daily. 06/16/11   [provider]  promethazine-dextromethorphan (PROMETHAZINE-DM) 6.25-15 MG/5ML syrup Take 5  mLs by mouth 4 (four) times daily as needed for cough. 06/06/19   McDonald, Laymond Purser, PA-C    Family History History reviewed. No pertinent family history.  Social History Social History   Tobacco Use  . Smoking status: Never Smoker  . Smokeless tobacco: Never Used  Substance Use Topics  . Alcohol use: No  . Drug use: No     Allergies   Patient has no known allergies.   Review of Systems Review of Systems  Constitutional: Negative for chills and fever.  HENT: Positive for congestion and sinus pressure. Negative for hearing loss and sore throat.   Eyes: Negative for pain.  Respiratory: Positive for cough and shortness of breath.   Cardiovascular: Positive for chest pain. Negative for palpitations.  Gastrointestinal: Negative for abdominal pain, diarrhea, nausea and vomiting.  Genitourinary: Negative for dysuria and hematuria.  Musculoskeletal: Positive for arthralgias.  Skin: Negative for rash.  Neurological: Positive for headaches.     Physical Exam Updated Vital Signs BP 123/76   Pulse 64   Temp 99 F (37.2 C) (Oral)   Resp 16   Ht 5\' 3"  (1.6 m)   Wt 81.6 kg   SpO2 96%   BMI 31.89 kg/m   Physical Exam Vitals signs and nursing note reviewed.  Constitutional:      General: She is not in acute distress.    Appearance: She is not  ill-appearing.  HENT:     Head: Normocephalic and atraumatic.     Nose: Nose normal.  Neck:     Musculoskeletal: Normal range of motion.  Cardiovascular:     Rate and Rhythm: Normal rate and regular rhythm.     Heart sounds: No murmur. No friction rub. No gallop.   Pulmonary:     Effort: Pulmonary effort is normal. No respiratory distress.     Breath sounds: Normal breath sounds. No stridor. No wheezing or rhonchi.  Chest:     Chest wall: Tenderness (Mild chest wall tenderness to palpation) present.  Abdominal:     General: Bowel sounds are normal.     Palpations: Abdomen is soft.     Tenderness: There is no abdominal  tenderness. There is no guarding.  Musculoskeletal:     Right lower leg: No edema.     Left lower leg: No edema.  Skin:    General: Skin is warm and dry.  Neurological:     Mental Status: She is alert.     Motor: No weakness.  Psychiatric:        Mood and Affect: Mood normal.        Behavior: Behavior normal.      ED Treatments / Results  Labs (all labs ordered are listed, but only abnormal results are displayed) Labs Reviewed  BASIC METABOLIC PANEL  CBC  TROPONIN I (HIGH SENSITIVITY)  TROPONIN I (HIGH SENSITIVITY)    EKG EKG Interpretation  Date/Time:  Wednesday June 25 2019 09:18:52 EDT Ventricular Rate:  64 PR Interval:    QRS Duration: 101 QT Interval:  423 QTC Calculation: 437 R Axis:   7 Text Interpretation:  Sinus rhythm Confirmed by Madalyn Rob [phone]) on 06/25/2019 11:10:34 AM   Radiology Dg Chest Port 1 View  Result Date: 06/25/2019 CLINICAL DATA:  Acute chest pain for several weeks. EXAM: PORTABLE CHEST 1 VIEW COMPARISON:  A 14 2020 and prior radiographs FINDINGS: The cardiomediastinal silhouette is unremarkable. There is no evidence of focal airspace disease, pulmonary edema, suspicious pulmonary nodule/mass, pleural effusion, or pneumothorax. No acute bony abnormalities are identified. Distal RIGHT clavicle resection versus resorption again noted. IMPRESSION: No active disease. Electronically Signed   By: Margarette Canada M.D.   On: 06/25/2019 09:54    Procedures Procedures (including critical care time)  Medications Ordered in ED Medications  sodium chloride flush (NS) 0.9 % injection 3 mL (has no administration in time range)     Initial Impression / Assessment and Plan / ED Course  I have reviewed the triage vital signs and the nursing notes.  Pertinent labs & imaging results that were available during my care of the patient were reviewed by me and considered in my medical decision making (see chart for details).  Patient is a 58 year old  female present today for ongoing 2-week chest pain that is constant and radiates into her arm left side; patient was reluctantly diagnosed with COVID and has been symptomatic for 2 weeks.  Chest pain is unchanged today but presented due to physician recommendation over the phone.   Broad differential for CC includes ACS, PE, COVID-related sx, viral pneumonia, PUD.  Patient presentation concerning for continuing symptoms of COVID.  Patient states that the pain is nonexertional and that symptoms are worse in the morning.  Patient has no history of ACS, no family history of heart disease, no history of diabetes, and has well-controlled hypertension and hyperlipidemia per patient.  Labs and imaging reassuring EKG and  chest x-ray with no acute changes from prior imaging studies.  Patient is likely experiencing discomfort due to her confirmed COVID.  Troponins negative today x1.  ACS is effectively ruled out at this time since chest pain is unchanged over the past week.  Disposition: Patient sent home with instructions to return for new or concerning symptoms.  Patient encouraged to use over-the-counter medications to control symptoms including ibuprofen (script sent), Tylenol, cough suppressants as needed.  Patient verbalizes understanding instructions and reasons for return precautions.          Final Clinical Impressions(s) / ED Diagnoses   Final diagnoses:  Chest pain    ED Discharge Orders    None       Tedd Sias, [place] 06/25/19 1319    Lucrezia Starch, MD 06/26/19 1101

## 2019-06-25 NOTE — Progress Notes (Signed)
RN states patient no longer needs PIV at this time. The patient is going to be discharged

## 2019-06-25 NOTE — Discharge Instructions (Addendum)
Use ibuprofen and Tylenol for pain control.    Your work-up today was negative for a heart attack please continue to monitor your symptoms and return if any new or concerning symptoms.

## 2019-06-25 NOTE — ED Notes (Signed)
Attempted blood draw x2 unsuccessful 

## 2019-06-25 NOTE — ED Triage Notes (Signed)
Pt presents with c/o chest pain for a couple of weeks. Pt reports the pain is radiating into her right arm. Pt reports that she tested positive on 8/14 for COVID. Pt reports she is still experiencing the symptoms of covid with decreased smell, decreased taste, nausea, cough, shortness of breath, and headaches.

## 2019-06-25 NOTE — ED Notes (Signed)
Megan RN in room trying to get ultrasound IV.

## 2019-07-08 ENCOUNTER — Ambulatory Visit (INDEPENDENT_AMBULATORY_CARE_PROVIDER_SITE_OTHER): Payer: Managed Care, Other (non HMO) | Admitting: Family Medicine

## 2019-07-08 ENCOUNTER — Ambulatory Visit: Payer: Self-pay

## 2019-07-08 ENCOUNTER — Encounter: Payer: Self-pay | Admitting: Family Medicine

## 2019-07-08 VITALS — BP 117/81 | HR 60 | Temp 97.0°F | Resp 20

## 2019-07-08 DIAGNOSIS — R059 Cough, unspecified: Secondary | ICD-10-CM

## 2019-07-08 DIAGNOSIS — R0600 Dyspnea, unspecified: Secondary | ICD-10-CM | POA: Diagnosis not present

## 2019-07-08 DIAGNOSIS — R05 Cough: Secondary | ICD-10-CM

## 2019-07-08 MED ORDER — FLUTICASONE PROPIONATE HFA 110 MCG/ACT IN AERO: INHALATION_SPRAY | RESPIRATORY_TRACT | 6 refills | Status: DC

## 2019-07-08 MED ORDER — AMOXICILLIN-POT CLAVULANATE 875-125 MG PO TABS: 1.0000 | ORAL_TABLET | Freq: Two times a day (BID) | ORAL | 0 refills | Status: DC

## 2019-07-08 MED ORDER — METHYLPREDNISOLONE 4 MG PO TBPK: ORAL_TABLET | ORAL | 0 refills | Status: DC

## 2019-07-08 MED ORDER — AZITHROMYCIN 250 MG PO TABS: ORAL_TABLET | ORAL | 0 refills | Status: DC

## 2019-07-08 MED ORDER — ALBUTEROL SULFATE HFA 108 (90 BASE) MCG/ACT IN AERS: 2.0000 | INHALATION_SPRAY | Freq: Four times a day (QID) | RESPIRATORY_TRACT | 3 refills | Status: DC | PRN

## 2019-07-08 NOTE — Progress Notes (Signed)
Office Visit Note   Patient: Shirley Hubbard           Date of Birth: 1961-06-01           MRN: [id number] Visit Date: 07/08/2019 Requested by: Verdell Carmine., MD [address] [place],  [place] [address] PCP: System, Pcp Not In  Subjective: Chief Complaint  Patient presents with  . Cough  . weakness/fatigue  . trouble breathing    HPI: She is here with cough and shortness of breath.  About a month ago she was diagnosed with COVID-19.  She did not require hospitalization but went to the ER a few times.  She had a chest x-ray early on which was unremarkable per her report.  She lost her sense of smell and taste and it still has not fully returned.  She feels very tired, has some chest pain and shortness of breath with nonproductive cough and sensation of wheezing.  No history of asthma, she is not diabetic.  Her husband has been sick with the same illness.               ROS: Denies fevers or chills.  She does have some diarrhea.  All other systems were reviewed and are negative.  Objective: Vital Signs: BP 117/81 (BP Location: Left Arm, Patient Position: Sitting, Cuff Size: Large)   Pulse 60   Temp (!) 97 F (36.1 C)   Resp 20   SpO2 96%   Physical Exam:  General:  Alert and oriented, in no acute distress. Pulm:  Breathing unlabored. Psy:  Normal mood, congruent affect. Skin: No visible rash. HEENT:  [place]/AT, PERRLA, EOM Full, no nystagmus.  Funduscopic examination within normal limits.  No conjunctival erythema.  Tympanic membranes are pearly gray with normal landmarks.  External ear canals are normal.  Nasal passages are clear.  Oropharynx is clear.  No significant lymphadenopathy.  No thyromegaly or nodules.  2+ carotid pulses without bruits. CV: Regular rate and rhythm without murmurs, rubs, or gallops.  No peripheral edema.  2+ radial and posterior tibial pulses. Lungs: She has a few inspiratory crackles in the left lung base.  Scattered wheezing but overall good air  movement throughout.   Imaging: Chest x-ray: Borderline enlarged heart, but probably normal.  Lung fields look clear, I do not see pneumonia.  Assessment & Plan: 1.  Cough and shortness of breath 1 month status post diagnosis of COVID-19, possible early left lung pneumonia. -Medrol Dosepak followed by inhaled steroids and albuterol as needed.  We will presumptively treat with Zithromax and Augmentin.  She will start taking zinc, vitamin D3, vitamin C. -Consider pulmonary consult if symptoms do not improve. -We will keep her out of work 3 more weeks.     Procedures: No procedures performed  No notes on file     PMFS History: Patient Active Problem List   Diagnosis Date Noted  . Hematoma - postoperative 05/16/2011  . Mass of shoulder, L scapular area  04/17/2011   Past Medical History:  Diagnosis Date  . Hypertension    on medication but cant remember the name  . Lipoma   . Seroma    axillary    History reviewed. No pertinent family history.  Past Surgical History:  Procedure Laterality Date  . ABDOMINAL HYSTERECTOMY  2000  . LIPOMA EXCISION     back  . seroma aspiration     Social History   Occupational History  . Not on file  Tobacco Use  .  Smoking status: Never Smoker  . Smokeless tobacco: Never Used  Substance and Sexual Activity  . Alcohol use: No  . Drug use: No  . Sexual activity: Not on file

## 2019-07-23 ENCOUNTER — Encounter: Payer: Self-pay | Admitting: Family Medicine

## 2019-07-23 ENCOUNTER — Ambulatory Visit (INDEPENDENT_AMBULATORY_CARE_PROVIDER_SITE_OTHER): Payer: Managed Care, Other (non HMO) | Admitting: Family Medicine

## 2019-07-23 VITALS — BP 115/80 | HR 73 | Temp 98.2°F

## 2019-07-23 DIAGNOSIS — R05 Cough: Secondary | ICD-10-CM | POA: Diagnosis not present

## 2019-07-23 DIAGNOSIS — R0600 Dyspnea, unspecified: Secondary | ICD-10-CM | POA: Diagnosis not present

## 2019-07-23 DIAGNOSIS — R059 Cough, unspecified: Secondary | ICD-10-CM

## 2019-07-23 NOTE — Progress Notes (Signed)
   Office Visit Note   Patient: Shirley Hubbard           Date of Birth: 1961-08-05           MRN: [id number] Visit Date: 07/23/2019 Requested by: No referring provider defined for this encounter. PCP: System, Pcp Not In  Subjective: Chief Complaint  Patient presents with  . followup - continues to have cough/cong, HA, SOB, diarrhea    HPI: She is here with persistent cough, shortness of breath, headache and diarrhea.  Same symptoms that she has had since being diagnosed with COVID-19 on August 14.  2 weeks ago we put her on Medrol Dosepak, antibiotics and inhalers.  She does not feel like is made any difference.  She was told by the public health tracking people that she did not need follow-up COVID-19 testing to be sure that she was cured.              ROS: No fevers or chills.  All other systems were reviewed and are negative.  Objective: Vital Signs: BP 115/80 (BP Location: Left Arm, Patient Position: Sitting, Cuff Size: Large)   Pulse 73   Temp 98.2 F (36.8 C)   Physical Exam:  General:  Alert and oriented, in no acute distress. Pulm:  Breathing unlabored. Psy:  Normal mood, congruent affect. Skin: No rash. CV: Regular rate and rhythm without murmurs, rubs, or gallops.  No peripheral edema.  2+ radial and posterior tibial pulses. Lungs: Clear to auscultation throughout with no wheezing or areas of consolidation.    Imaging: None today  Assessment & Plan: 1.  Persistent cough, shortness of breath, diarrhea.  Question whether she is still actively infected or whether this is postinfectious lung irritation. -I will refer her to pulmonologist for additional management.  Keep her out of work for 1 month while awaiting specialist consult.     Procedures: No procedures performed  No notes on file     PMFS History: Patient Active Problem List   Diagnosis Date Noted  . Hematoma - postoperative 05/16/2011  . Mass of shoulder, L scapular area  04/17/2011   Past  Medical History:  Diagnosis Date  . Hypertension    on medication but cant remember the name  . Lipoma   . Seroma    axillary    History reviewed. No pertinent family history.  Past Surgical History:  Procedure Laterality Date  . ABDOMINAL HYSTERECTOMY  2000  . LIPOMA EXCISION     back  . seroma aspiration     Social History   Occupational History  . Not on file  Tobacco Use  . Smoking status: Never Smoker  . Smokeless tobacco: Never Used  Substance and Sexual Activity  . Alcohol use: No  . Drug use: No  . Sexual activity: Not on file

## 2019-08-04 ENCOUNTER — Telehealth: Payer: Self-pay | Admitting: Pulmonary Disease

## 2019-08-04 NOTE — Telephone Encounter (Signed)
OK to keep appointment. Please remind me prior to entering room.

## 2019-08-04 NOTE — Telephone Encounter (Signed)
Spoke with patient.  Patient states she took ibuprofen today , I instructed her not to take and if she had to , to notify our office.   Nothing further needed at this time.

## 2019-08-04 NOTE — Telephone Encounter (Signed)
Spoke with the pt  Msg was taken bc she answered yes to cough, SOB, congestion on covid screen  Pt reports these symptoms started back in August 2020 when she tested pos for Covid (06/06/19) She states no recent fever, chills, body aches  Would like to keep appt  Please advise if ok, thanks

## 2019-08-05 ENCOUNTER — Encounter: Payer: Self-pay | Admitting: Pulmonary Disease

## 2019-08-05 ENCOUNTER — Encounter: Payer: Self-pay | Admitting: *Deleted

## 2019-08-05 ENCOUNTER — Other Ambulatory Visit: Payer: Self-pay

## 2019-08-05 ENCOUNTER — Ambulatory Visit (INDEPENDENT_AMBULATORY_CARE_PROVIDER_SITE_OTHER): Payer: Managed Care, Other (non HMO) | Admitting: Pulmonary Disease

## 2019-08-05 VITALS — BP 138/72 | HR 74 | Temp 97.0°F | Ht 62.25 in | Wt 203.6 lb

## 2019-08-05 DIAGNOSIS — R0681 Apnea, not elsewhere classified: Secondary | ICD-10-CM

## 2019-08-05 DIAGNOSIS — R0602 Shortness of breath: Secondary | ICD-10-CM

## 2019-08-05 DIAGNOSIS — G4719 Other hypersomnia: Secondary | ICD-10-CM | POA: Diagnosis not present

## 2019-08-05 DIAGNOSIS — Z8619 Personal history of other infectious and parasitic diseases: Secondary | ICD-10-CM

## 2019-08-05 DIAGNOSIS — Z8616 Personal history of COVID-19: Secondary | ICD-10-CM

## 2019-08-05 LAB — BASIC METABOLIC PANEL
BUN: 16 mg/dL (ref 6–23)
CO2: 29 mEq/L (ref 19–32)
Calcium: 9.7 mg/dL (ref 8.4–10.5)
Chloride: 106 mEq/L (ref 96–112)
Creatinine, Ser: 0.79 mg/dL (ref 0.40–1.20)
GFR: 89.81 mL/min (ref >60.00)
Glucose, Bld: 97 mg/dL (ref 70–99)
Potassium: 3.7 mEq/L (ref 3.5–5.1)
Sodium: 142 mEq/L (ref 135–145)

## 2019-08-05 MED ORDER — BREO ELLIPTA 200-25 MCG/INH IN AEPB: 1.0000 | INHALATION_SPRAY | Freq: Every day | RESPIRATORY_TRACT | 3 refills | Status: DC

## 2019-08-05 MED ORDER — BREO ELLIPTA 200-25 MCG/INH IN AEPB: 1.0000 | INHALATION_SPRAY | Freq: Every day | RESPIRATORY_TRACT | 0 refills | Status: DC

## 2019-08-05 NOTE — Patient Instructions (Addendum)
Shortness of breath, chest pain --Obtain CTA to rule out blood clots --Will arrange for pulmonary function tests --START Breo 1 puff once a day  Witnessed apnea --Home sleep study  Follow-up in 1 month

## 2019-08-05 NOTE — Progress Notes (Signed)
Subjective:   PATIENT ID: Shirley Hubbard GENDER: female DOB: 10-06-1961, MRN: [id number]   HPI  Chief Complaint  Patient presents with  . Consult    shortness of breath, cough, weakness, chest pain since august 2020    Reason for Visit: New consult for weakness and chest pain  Shirley Hubbard is a 58 year old female with prior COVID-19 infection in August 2020 who presents for shortness of breath.  Since her diagnosis, she has been evaluated in the ED for chest pain and seen by her PCP for productive cough. On the latter visit she was prescribed Augmentin, Azithromycin and Medrol dose pack with no changes in symptoms.  Compared to August, she rates her symptoms as a 10. She is not able to walk 5-10 minutes before needing to stop and rest. Her quality of breathing has changed and is more challenging due to her chest pain. Reports associated with wheezing. Activity drains her including putting on shoes, showering and making the bed. Her husband has reports that she seems to struggle at night to breath and became more short and shallow so he will wake her up. Before being diagnosed with COVID, he reports snoring and witnessed apnea. She takes the albuterol up to three times a day. The albuterol only partially improves her symptoms. She does not use her Flovent often, may twice a week. She feels palpitations daily. She has a chronic productive cough with yellow sputum 3-4 times a day.  She reports excessive fatigue and sleepiness in the day. Wakes up with morning headaches. If she is sitting alone watching TV or after lunch, she is likely to fall asleep. Denies falling asleep when actively engaged with others or while driving.  Social History: Never smoker  I have personally reviewed patient's past medical/family/social history, allergies, current medications.  Past Medical History:  Diagnosis Date  . Hyperlipidemia   . Hypertension    on medication but cant remember the name  .  Lipoma   . Seroma    axillary     Family History  Problem Relation Age of Onset  . Healthy Sister   . Healthy Brother   . Healthy Sister   . Healthy Sister   . Healthy Brother   . Healthy Brother   . Healthy Sister   . Healthy Brother      Social History   Occupational History  . Not on file  Tobacco Use  . Smoking status: Never Smoker  . Smokeless tobacco: Never Used  Substance and Sexual Activity  . Alcohol use: No  . Drug use: No  . Sexual activity: Not on file    No Known Allergies   Outpatient Medications Prior to Visit  Medication Sig Dispense Refill  . acetaminophen (TYLENOL) 500 MG tablet Take by mouth.    Marland Kitchen albuterol (VENTOLIN HFA) 108 (90 Base) MCG/ACT inhaler Inhale 2 puffs into the lungs every 6 (six) hours as needed for wheezing or shortness of breath. 18 g 3  . aspirin EC 81 MG tablet Take by mouth.    Marland Kitchen atorvastatin (LIPITOR) 40 MG tablet TAKE ONE TABLET BY MOUTH EVERY NIGHT AT BEDTIME    . candesartan (ATACAND) 32 MG tablet TAKE ONE TABLET BY MOUTH DAILY    . ferrous sulfate 325 (65 FE) MG tablet Take by mouth.    . fluticasone (FLOVENT HFA) 110 MCG/ACT inhaler 2 Puffs BID x 1 month, then 1 puff BID x 2 months 1 Inhaler 6  . Multiple  Vitamin (MULTI-VITAMIN) tablet Take by mouth.    . naproxen (NAPROSYN) 500 MG tablet Take by mouth.     No facility-administered medications prior to visit.     Review of Systems  Constitutional: Positive for diaphoresis and malaise/fatigue. Negative for chills, fever and weight loss.  HENT: Positive for congestion, ear pain and sore throat.   Respiratory: Positive for cough, sputum production, shortness of breath and wheezing. Negative for hemoptysis.   Cardiovascular: Positive for chest pain and palpitations. Negative for leg swelling.  Gastrointestinal: Positive for nausea. Negative for abdominal pain and heartburn.  Genitourinary: Positive for frequency.  Musculoskeletal: Negative for joint pain and myalgias.   Skin: Negative for itching and rash.  Neurological: Positive for dizziness, weakness and headaches.  Endo/Heme/Allergies: Does not bruise/bleed easily.  Psychiatric/Behavioral: Negative for depression. The patient is not nervous/anxious.      Objective:   Vitals:   08/05/19 0934 08/05/19 0937  BP:  138/72  Pulse:  74  Temp: (!) 97 F (36.1 C)   TempSrc: Temporal   SpO2:  97%  Weight: 203 lb 9.6 oz (92.4 kg)   Height: 5' 2.25" (1.581 m)    SpO2: 97 % O2 Device: None (Room air)  Physical Exam: General: BMI 36.9. Well-appearing, no acute distress HENT: [place], AT Eyes: EOMI, no scleral icterus Respiratory: Clear to auscultation bilaterally.  No crackles, wheezing or rales Cardiovascular: RRR, -M/R/G, no JVD GI: BS+, soft, nontender Extremities:-Edema,-tenderness Neuro: AAO x4, CNII-XII grossly intact Skin: Intact, no rashes or bruising Psych: Normal mood, normal affect  Data Reviewed:  Imaging: CXR  07/08/19 - no infiltrate, edema or effusion  PFT: None on file  Labs: CBC    Component Value Date/Time   WBC 5.7 06/25/2019 0919   RBC 4.35 06/25/2019 0919   HGB 12.4 06/25/2019 0919   HCT 39.7 06/25/2019 0919   PLT 203 06/25/2019 0919   MCV 91.3 06/25/2019 0919   MCH 28.5 06/25/2019 0919   MCHC 31.2 06/25/2019 0919   RDW 13.1 06/25/2019 0919   BMET    Component Value Date/Time   NA 142 08/05/2019 1056   K 3.7 08/05/2019 1056   CL 106 08/05/2019 1056   CO2 29 08/05/2019 1056   GLUCOSE 97 08/05/2019 1056   BUN 16 08/05/2019 1056   CREATININE 0.79 08/05/2019 1056   CALCIUM 9.7 08/05/2019 1056   GFRNONAA >60 06/25/2019 0919   GFRAA >60 06/25/2019 0919   Imaging, labs and tests noted above have been reviewed independently by me.    Assessment & Plan:   Discussion: 58 year old female with prior COVID-19 infection who presents with chronic shortness of breath. Doubt this is an acute infection or recurrence of her COVID pneumonia. Though I suspect  deconditioning is contributing significantly to her symptoms. There has been limited data regarding abnormal coagulation issues patients with prior history of COVID, so will obtain CTA for further evaluation. I also suspect she may have underlying obstructive sleep apnea with her BMI, snoring and witness apena. Her calculated Epworth score is 10.  Shortness of breath, chest pain --Obtain CTA to rule out blood clots --Will arrange for pulmonary function tests --START Breo 1 puff once a day  Witnessed apnea --Home sleep study  Health Maintenance Immunization History  Administered Date(s) Administered  . Tdap 03/23/2008, 07/10/2018    Orders Placed This Encounter  Procedures  . CT Angio Chest W/Cm &/Or Wo Cm    Please schedule this week    Standing Status:  Future    Standing Expiration Date:   11/04/2020    Order Specific Question:   If indicated for the ordered procedure, I authorize the administration of contrast media per Radiology protocol    Answer:   Yes    Order Specific Question:   Is patient pregnant?    Answer:   No    Order Specific Question:   Preferred imaging location?    Answer:   [facility]    Order Specific Question:   Radiology Contrast Protocol - do NOT remove file path    Answer:   [email or web address].[email or web address]  . Basic Metabolic Panel (BMET)    Standing Status:   Future    Number of Occurrences:   1    Standing Expiration Date:   08/04/2020  . Pulmonary function test    Standing Status:   Future    Standing Expiration Date:   08/04/2020    Order Specific Question:   Where should this test be performed?    Answer:   [facility]    Order Specific Question:   Full PFT: includes the following: basic spirometry, spirometry pre & post bronchodilator, diffusion capacity (DLCO), lung volumes    Answer:   Full PFT  . Home sleep test    Standing Status:   Future    Standing Expiration Date:   08/04/2020    Order Specific Question:    Where should this test be performed:    Answer:   [facility]   Meds ordered this encounter  Medications  . fluticasone furoate-vilanterol (BREO ELLIPTA) 200-25 MCG/INH AEPB    Sig: Inhale 1 puff into the lungs daily.    Dispense:  1 each    Refill:  3  . fluticasone furoate-vilanterol (BREO ELLIPTA) 200-25 MCG/INH AEPB    Sig: Inhale 1 puff into the lungs daily.    Dispense:  1 each    Refill:  0    Order Specific Question:   Lot Number?    Answer:   [id number]    Order Specific Question:   Expiration Date?    Answer:   11/24/2019    Order Specific Question:   Manufacturer?    Answer:   [employer] [[id number]]    Order Specific Question:   Quantity    Answer:   2    Return in about 1 month (around 09/05/2019).  [place], MD [facility] 08/05/2019 10:17 AM  Office Number [phone]

## 2019-08-05 NOTE — Progress Notes (Signed)
Patient seen in the office today and instructed on use of breo 200 ellipta.  Patient expressed understanding and demonstrated technique.

## 2019-08-06 DIAGNOSIS — G4719 Other hypersomnia: Secondary | ICD-10-CM | POA: Insufficient documentation

## 2019-08-07 ENCOUNTER — Other Ambulatory Visit: Payer: Self-pay

## 2019-08-07 ENCOUNTER — Ambulatory Visit (HOSPITAL_BASED_OUTPATIENT_CLINIC_OR_DEPARTMENT_OTHER)
Admission: RE | Admit: 2019-08-07 | Discharge: 2019-08-07 | Disposition: A | Discharge status: Home / Self Care | Payer: Managed Care, Other (non HMO) | Source: Ambulatory Visit | Attending: Pulmonary Disease | Admitting: Pulmonary Disease

## 2019-08-07 DIAGNOSIS — R0602 Shortness of breath: Secondary | ICD-10-CM | POA: Diagnosis not present

## 2019-08-07 IMAGING — CT CT ANGIO CHEST
2 of 8 series · 18 of 36 positions shown · IV contrast (omnipaque)
Comparison: None.

CLINICAL DATA: Shortness of breath on exertion and fatigue since
[KF] diagnosis in [REDACTED].

EXAM:
CT ANGIOGRAPHY CHEST WITH CONTRAST
TECHNIQUE: Multidetector CT imaging of the chest was performed using the
standard protocol during bolus administration of intravenous
contrast. Multiplanar CT image reconstructions and MIPs were
obtained to evaluate the vascular anatomy.
CONTRAST:  100mL OMNIPAQUE IOHEXOL 350 MG/ML SOLN

[Series 6: pe thins · axial · 0.80mm/px · z∈[-159,+66]mm · 17 of 334 slices shown]
[im 17/334  lung]
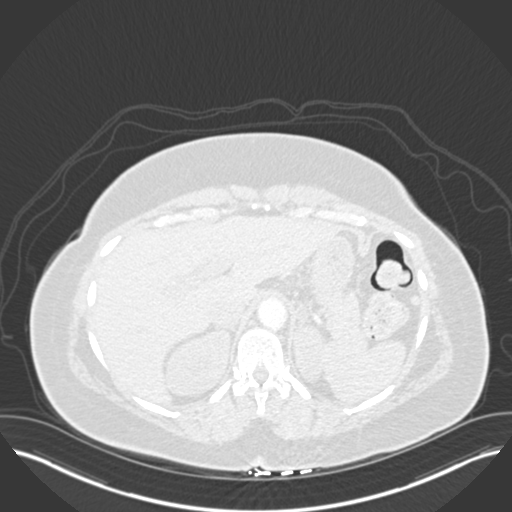
[im 34/334  mediastinal]
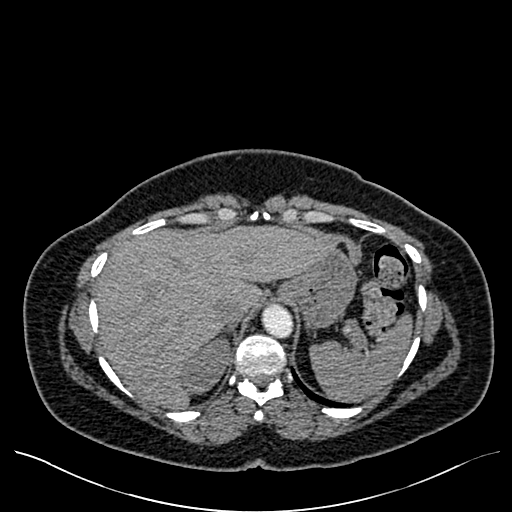
[im 50/334  lung]
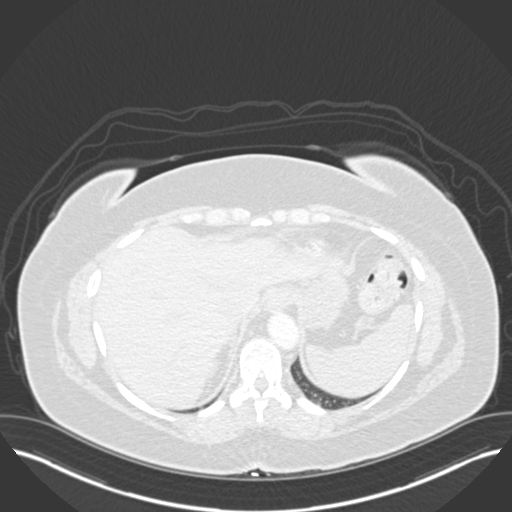
[im 67/334  mediastinal]
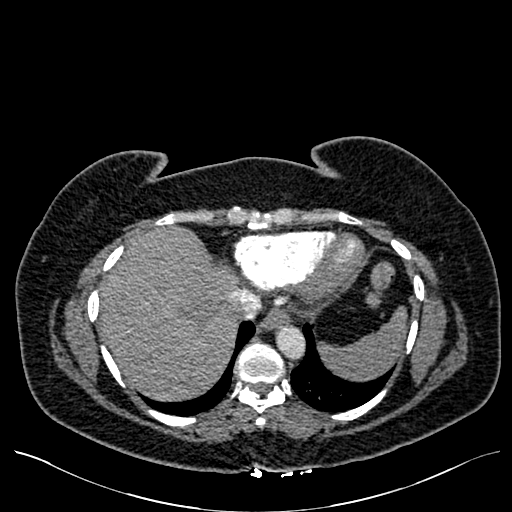
[im 100/334  lung]
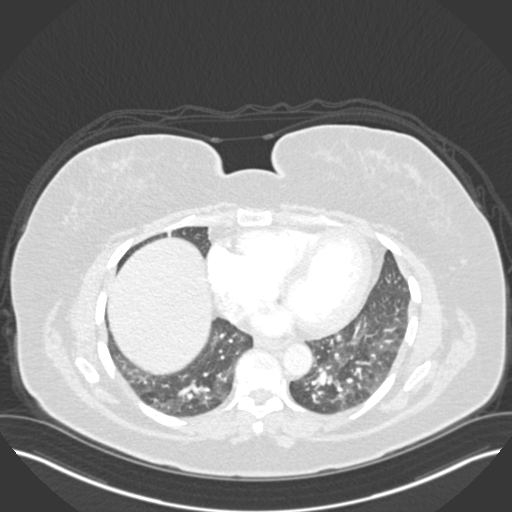
[im 117/334  mediastinal]
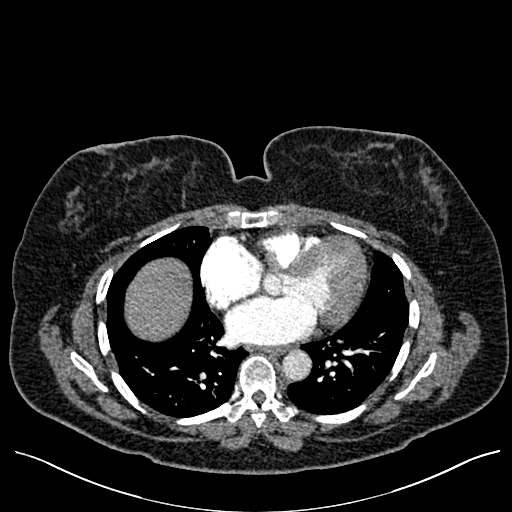
[im 134/334  lung]
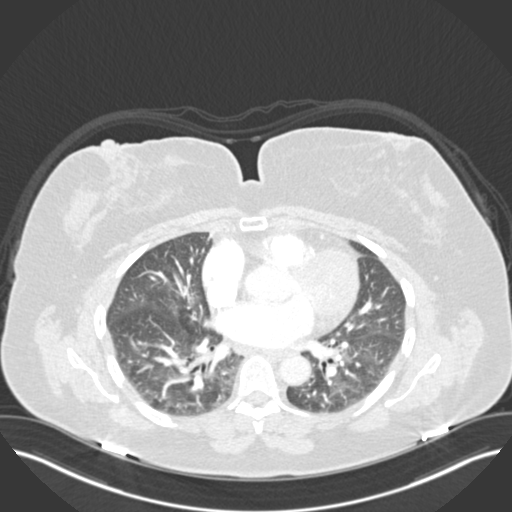
[im 150/334  mediastinal]
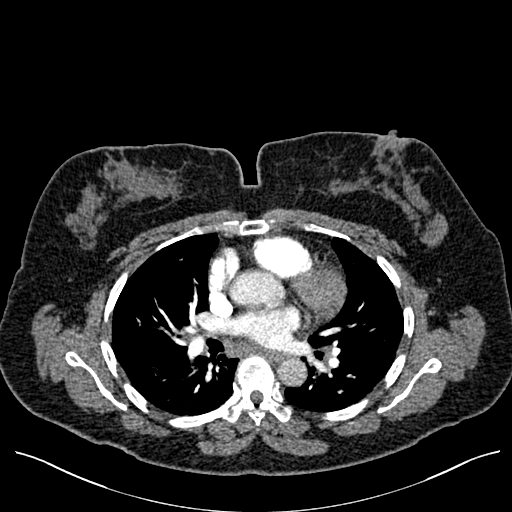
[im 167/334  lung]
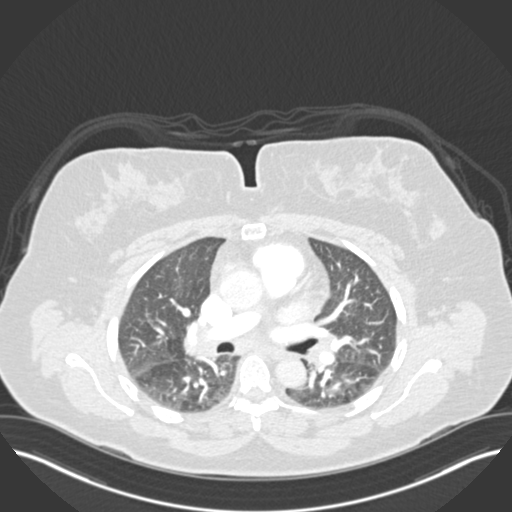
[im 184/334  mediastinal]
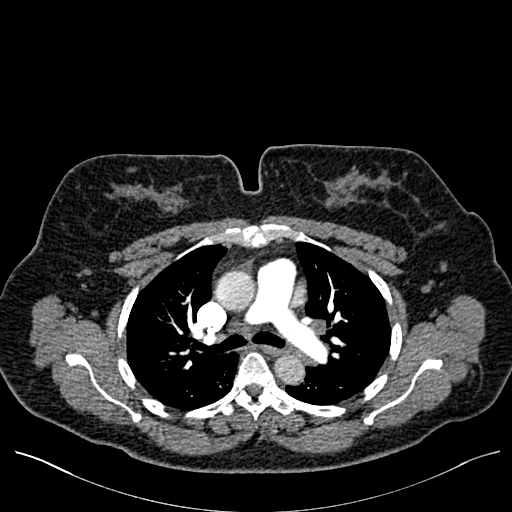
[im 200/334  lung]
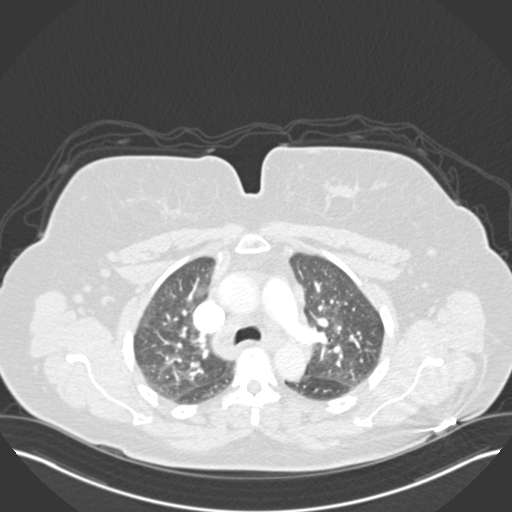
[im 217/334  mediastinal]
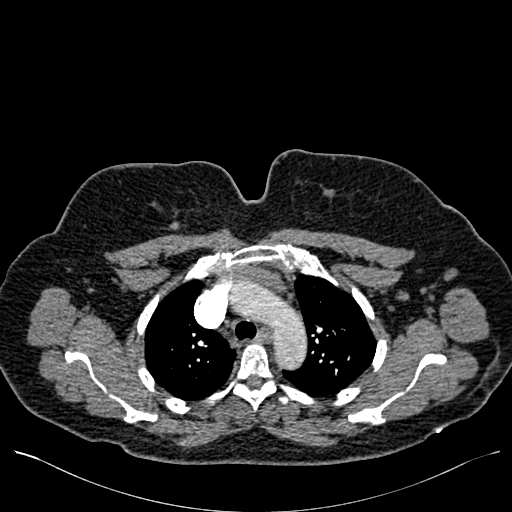
[im 234/334  lung]
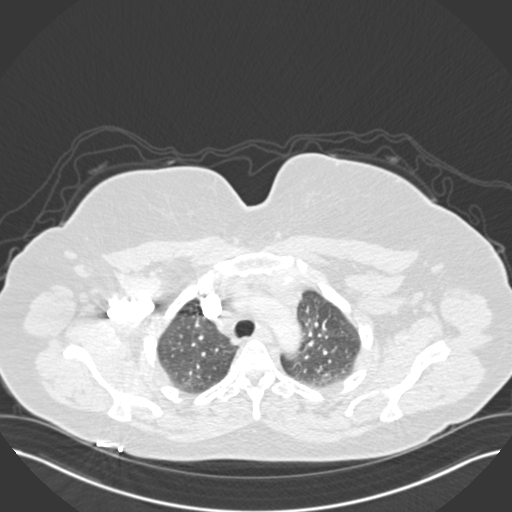
[im 267/334  mediastinal]
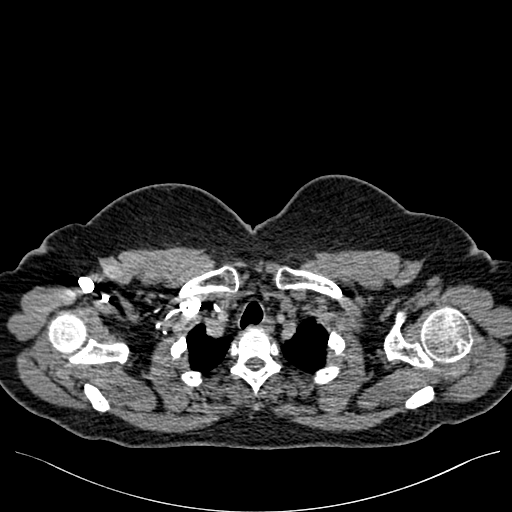
[im 284/334  lung]
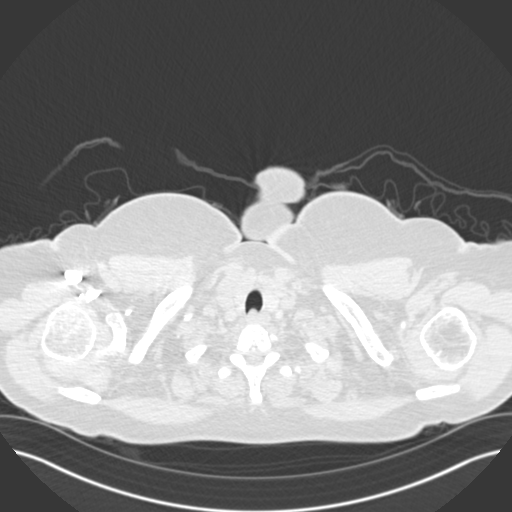
[im 300/334  mediastinal]
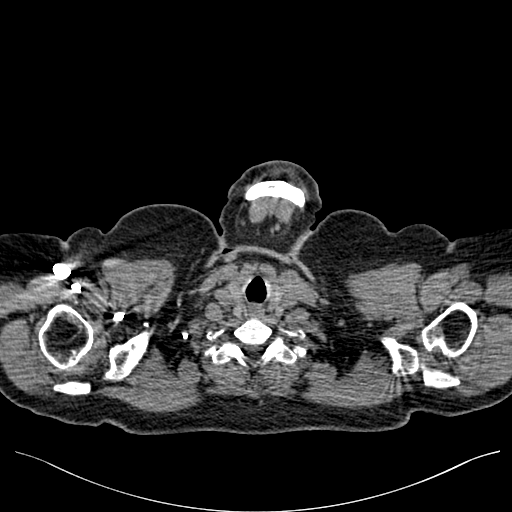
[im 317/334  lung]
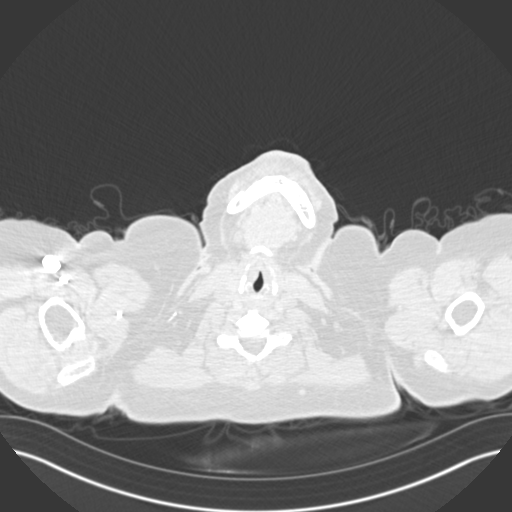

[Series 7: pe coronal mpr · coronal · 0.54mm/px · 1 of 101 slices shown]
[im 51/101  mediastinal]
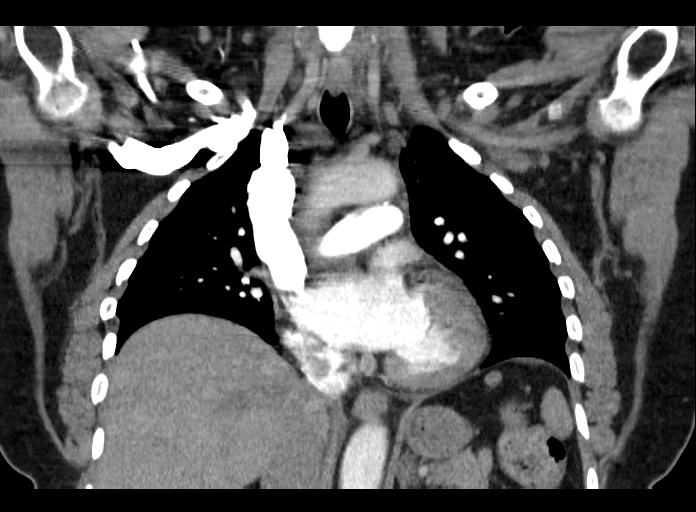

[18 of 36 positions shown; findings below may reference images not displayed]

FINDINGS: Cardiovascular: Satisfactory opacification of the pulmonary arteries
to the segmental level. No evidence of pulmonary embolism. Normal
heart size. No pericardial effusion.

Mediastinum/Nodes: Borderline mediastinal, hilar and axillary lymph
lymphadenopathy. Normal appearance of the trachea, esophagus and
thyroid gland.

Lungs/Pleura: Geographic areas of mosaic attenuation of the lung
parenchyma with dependent predominance may represent areas of air
trapping or hypoperfusion.

Upper Abdomen: Shotty lymph nodes in the gastrohepatic ligament. Two
7 mm soft tissue nodules in the left upper quadrant may represent
round subcentimeter lymph nodes or splenules.

Musculoskeletal: No chest wall abnormality. No acute or significant
osseous findings.

Review of the MIP images confirms the above findings.
IMPRESSION: 1. No evidence of pulmonary embolus.
2. Geographic areas of mosaic attenuation of the lung parenchyma
with dependent predominance may represent areas of air trapping or
hypoventilation/hypoperfusion.
3. Borderline mediastinal, and hilar lymphadenopathy. Mildly
prominent axillary lymph nodes.
4. Two 7 mm soft tissue nodules in the left upper quadrant may
represent round subcentimeter lymph nodes or splenules.
5. Shotty lymph nodes in the gastrohepatic ligament.
6. These findings may be secondary to reactive lymphadenopathy,
especially given recent [KF] infection. Lymphoproliferative
disorder however is also on the differential diagnosis. Please
correlate clinically.

## 2019-08-07 MED ORDER — IOHEXOL 350 MG/ML SOLN
100.0000 mL | Freq: Once | INTRAVENOUS | Status: AC | PRN
  Administered 2019-08-07: 100 mL via INTRAVENOUS

## 2019-08-12 ENCOUNTER — Telehealth: Payer: Self-pay | Admitting: Pulmonary Disease

## 2019-08-12 NOTE — Telephone Encounter (Signed)
Spoke with pt, she states she needs a letter to continue to be out of work until after her PFT because she was written out until 09/23/2019 but her PFT is not scheduled until 10/13/2019. Can she have the extension to be out of work until then? JE please advise.

## 2019-08-14 ENCOUNTER — Encounter: Payer: Self-pay | Admitting: Emergency Medicine

## 2019-08-14 NOTE — Telephone Encounter (Signed)
Pt called back - she would like it mailed to her home address - nothing further needed -pr

## 2019-08-14 NOTE — Telephone Encounter (Signed)
Ok to for work note to be written until 10/13/19

## 2019-08-14 NOTE — Telephone Encounter (Signed)
LMOM TCB x1 Where would patient like the letter sent - MyChart, printed/mailed, faxed?

## 2019-08-14 NOTE — Telephone Encounter (Signed)
Letter has been generated and placed in outgoing mail. Nothing further needed. Will sign off.

## 2019-08-19 ENCOUNTER — Ambulatory Visit: Payer: Managed Care, Other (non HMO)

## 2019-08-19 ENCOUNTER — Other Ambulatory Visit: Payer: Self-pay

## 2019-08-19 DIAGNOSIS — R0602 Shortness of breath: Secondary | ICD-10-CM

## 2019-08-28 ENCOUNTER — Other Ambulatory Visit: Payer: Self-pay

## 2019-08-28 ENCOUNTER — Ambulatory Visit: Payer: Managed Care, Other (non HMO)

## 2019-08-29 DIAGNOSIS — G4733 Obstructive sleep apnea (adult) (pediatric): Secondary | ICD-10-CM | POA: Diagnosis not present

## 2019-09-01 DIAGNOSIS — G4733 Obstructive sleep apnea (adult) (pediatric): Secondary | ICD-10-CM | POA: Diagnosis not present

## 2019-09-04 ENCOUNTER — Telehealth: Payer: Self-pay | Admitting: Pulmonary Disease

## 2019-09-04 NOTE — Telephone Encounter (Signed)
Called and spoke with patient.  Patient requested home sleep results.  Results are not in system at this time. Explained someone will contact her with results once available.  Nothing further at this time.

## 2019-09-10 ENCOUNTER — Encounter: Payer: Self-pay | Admitting: Pulmonary Disease

## 2019-09-10 ENCOUNTER — Other Ambulatory Visit: Payer: Self-pay

## 2019-09-10 ENCOUNTER — Ambulatory Visit (INDEPENDENT_AMBULATORY_CARE_PROVIDER_SITE_OTHER): Payer: Managed Care, Other (non HMO) | Admitting: Pulmonary Disease

## 2019-09-10 VITALS — BP 140/68 | HR 74 | Temp 97.3°F | Ht 63.0 in | Wt 212.4 lb

## 2019-09-10 DIAGNOSIS — G4733 Obstructive sleep apnea (adult) (pediatric): Secondary | ICD-10-CM

## 2019-09-10 DIAGNOSIS — Z8616 Personal history of COVID-19: Secondary | ICD-10-CM

## 2019-09-10 DIAGNOSIS — R0602 Shortness of breath: Secondary | ICD-10-CM

## 2019-09-10 DIAGNOSIS — Z8619 Personal history of other infectious and parasitic diseases: Secondary | ICD-10-CM | POA: Diagnosis not present

## 2019-09-10 MED ORDER — BREO ELLIPTA 200-25 MCG/INH IN AEPB: 1.0000 | INHALATION_SPRAY | Freq: Every day | RESPIRATORY_TRACT | 0 refills | Status: DC

## 2019-09-10 MED ORDER — HYDROCODONE-HOMATROPINE 5-1.5 MG/5ML PO SYRP: 5.0000 mL | ORAL_SOLUTION | Freq: Four times a day (QID) | ORAL | 0 refills | Status: DC | PRN

## 2019-09-10 NOTE — Patient Instructions (Signed)
Shortness of breath, chest pain, cough --CONTINUE Breo 200 1 puff once a day --Cough syrup as needed --Continue being active  Obstructive sleep apnea --Sleep study 08/29/19 showed mild OSA with an AHI of 13.3 and SpO2 low of 82%.  --START AutoPAP

## 2019-09-10 NOTE — Progress Notes (Signed)
Subjective:   PATIENT ID: Shirley Hubbard GENDER: female DOB: Feb 03, 1961, MRN: [id number]   HPI  Chief Complaint  Patient presents with  . Follow-up    Pt is here for a 1 month follow up. Patient states she is still short of breath and feels very tired. Patient states she has a cough that is making her chest hurt and isn't always productive.     Reason for Visit: Follow-up  [name] is a 58 year old female with COVID-19 infection in August 2020 who presents for follow-up for shortness of breath.  Since our last visit 08/05/19, she has been using her Breo most days the week. She also had testing completed including CTA which was negative and sleep study from 08/29/19 showed mild OSA with an AHI of 13.3 and SpO2 low of 82%.   She has been working to be more active and walking with her 1/2 mile at least 3-4 times a week. She reports harsh non-productive cough everyday 2-3 times associated with severe chest pain from coughing. She still reports memory loss and fatigue.  Social History: Never smoker  I have personally reviewed patient's past medical/family/social history/allergies/current medications.  Past Medical History:  Diagnosis Date  . Hyperlipidemia   . Hypertension    on medication but cant remember the name  . Lipoma   . Seroma    axillary     Family History  Problem Relation Age of Onset  . Healthy Sister   . Healthy Brother   . Healthy Sister   . Healthy Sister   . Healthy Brother   . Healthy Brother   . Healthy Sister   . Healthy Brother      Social History   Occupational History  . Not on file  Tobacco Use  . Smoking status: Never Smoker  . Smokeless tobacco: Never Used  Substance and Sexual Activity  . Alcohol use: No  . Drug use: No  . Sexual activity: Not on file    No Known Allergies   Outpatient Medications Prior to Visit  Medication Sig Dispense Refill  . acetaminophen (TYLENOL) 500 MG tablet Take by mouth.    Marland Kitchen albuterol  (VENTOLIN HFA) 108 (90 Base) MCG/ACT inhaler Inhale 2 puffs into the lungs every 6 (six) hours as needed for wheezing or shortness of breath. 18 g 3  . aspirin EC 81 MG tablet Take by mouth.    Marland Kitchen atorvastatin (LIPITOR) 40 MG tablet TAKE ONE TABLET BY MOUTH EVERY NIGHT AT BEDTIME    . candesartan (ATACAND) 32 MG tablet TAKE ONE TABLET BY MOUTH DAILY    . ferrous sulfate 325 (65 FE) MG tablet Take by mouth.    . fluticasone furoate-vilanterol (BREO ELLIPTA) 200-25 MCG/INH AEPB Inhale 1 puff into the lungs daily. 1 each 3  . Multiple Vitamin (MULTI-VITAMIN) tablet Take by mouth.    . fluticasone (FLOVENT HFA) 110 MCG/ACT inhaler 2 Puffs BID x 1 month, then 1 puff BID x 2 months (Patient not taking: Reported on 09/10/2019) 1 Inhaler 6  . fluticasone furoate-vilanterol (BREO ELLIPTA) 200-25 MCG/INH AEPB Inhale 1 puff into the lungs daily. (Patient not taking: Reported on 09/10/2019) 1 each 0   No facility-administered medications prior to visit.     Review of Systems  Constitutional: Positive for malaise/fatigue. Negative for chills, diaphoresis, fever and weight loss.  HENT: Negative for congestion.   Respiratory: Positive for cough and shortness of breath. Negative for hemoptysis, sputum production and wheezing.  Cardiovascular: Negative for chest pain, palpitations and leg swelling.  Neurological: Positive for headaches.       Forgetfulness     Objective:   Vitals:   09/10/19 1434  BP: 140/68  Pulse: 74  Temp: (!) 97.3 F (36.3 C)  TempSrc: Temporal  SpO2: 95%  Weight: 212 lb 6.4 oz (96.3 kg)  Height: 5\' 3"  (1.6 m)   SpO2: 95 %(on RA) O2 Device: None (Room air)  Physical Exam: General: Well-appearing, no acute distress HENT: [place], AT Eyes: EOMI, no scleral icterus Respiratory: Clear to auscultation bilaterally.  No crackles, wheezing or rales Cardiovascular: RRR, -M/R/G, no JVD GI: BS+, soft, nontender Extremities:-Edema,-tenderness Neuro: AAO x4, CNII-XII grossly intact  Skin: Intact, no rashes or bruising Psych: Normal mood, normal affect  Data Reviewed:  Imaging: CXR  07/08/19 - no infiltrate, edema or effusion  PFT: None on file  Sleep study: 08/29/19- Mild OSA with an AHI of 13.3 and SpO2 low of 82%.    Labs: CBC    Component Value Date/Time   WBC 5.7 06/25/2019 0919   RBC 4.35 06/25/2019 0919   HGB 12.4 06/25/2019 0919   HCT 39.7 06/25/2019 0919   PLT 203 06/25/2019 0919   MCV 91.3 06/25/2019 0919   MCH 28.5 06/25/2019 0919   MCHC 31.2 06/25/2019 0919   RDW 13.1 06/25/2019 0919   BMET    Component Value Date/Time   NA 142 08/05/2019 1056   K 3.7 08/05/2019 1056   CL 106 08/05/2019 1056   CO2 29 08/05/2019 1056   GLUCOSE 97 08/05/2019 1056   BUN 16 08/05/2019 1056   CREATININE 0.79 08/05/2019 1056   CALCIUM 9.7 08/05/2019 1056   GFRNONAA >60 06/25/2019 0919   GFRAA >60 06/25/2019 0919   Imaging, labs and tests noted above have been reviewed independently by me.    Assessment & Plan:   Discussion: 58 year-old female with prior COVID-19 infection who presents with chronic shortness of breath. She is more active since our last visit and has been compliant with her Memory Dance which she reports benefit. Sleep study was also positive for sleep apnea and given her severe symptoms, I counseled patient on the possible benefit this may give for her dyspnea and fatigue. We also discussed weight loss and oral appliance. She agreed to trial CPAP and we will visit again.  Shortness of breath, chest pain, cough --CONTINUE Breo 200 1 puff once a day --Cough syrup as needed --Continue being active  Obstructive sleep apnea --Sleep study 08/29/19 showed mild OSA with an AHI of 13.3 and SpO2 low of 82%.  --START AutoPAP  Health Maintenance Immunization History  Administered Date(s) Administered  . Tdap 03/23/2008, 07/10/2018    Orders Placed This Encounter  Procedures  . Ambulatory Referral for DME    Referral Priority:   Routine     Referral Type:   Durable Medical Equipment Purchase    Number of Visits Requested:   1   Meds ordered this encounter  Medications  . HYDROcodone-homatropine (HYCODAN) 5-1.5 MG/5ML syrup    Sig: Take 5 mLs by mouth every 6 (six) hours as needed for cough.    Dispense:  240 mL    Refill:  0  . fluticasone furoate-vilanterol (BREO ELLIPTA) 200-25 MCG/INH AEPB    Sig: Inhale 1 puff into the lungs daily.    Dispense:  28 each    Refill:  0    Return in about 3 months (around 12/11/2019).  Magdelyn Roebuck Rodman Pickle, MD  [facility] 09/10/2019 2:37 PM  Office Number [phone]

## 2019-09-21 DIAGNOSIS — Z8616 Personal history of COVID-19: Secondary | ICD-10-CM | POA: Insufficient documentation

## 2019-09-21 DIAGNOSIS — G4733 Obstructive sleep apnea (adult) (pediatric): Secondary | ICD-10-CM | POA: Insufficient documentation

## 2019-09-21 DIAGNOSIS — Z8619 Personal history of other infectious and parasitic diseases: Secondary | ICD-10-CM | POA: Insufficient documentation

## 2019-09-21 DIAGNOSIS — R0602 Shortness of breath: Secondary | ICD-10-CM | POA: Insufficient documentation

## 2019-09-22 ENCOUNTER — Other Ambulatory Visit: Payer: Self-pay | Admitting: Family Medicine

## 2019-09-22 ENCOUNTER — Telehealth: Payer: Self-pay | Admitting: Family Medicine

## 2019-09-22 ENCOUNTER — Other Ambulatory Visit: Payer: Self-pay

## 2019-09-22 ENCOUNTER — Encounter: Payer: Self-pay | Admitting: Family Medicine

## 2019-09-22 ENCOUNTER — Ambulatory Visit (INDEPENDENT_AMBULATORY_CARE_PROVIDER_SITE_OTHER): Payer: Managed Care, Other (non HMO) | Admitting: Family Medicine

## 2019-09-22 DIAGNOSIS — N6320 Unspecified lump in the left breast, unspecified quadrant: Secondary | ICD-10-CM

## 2019-09-22 DIAGNOSIS — I6529 Occlusion and stenosis of unspecified carotid artery: Secondary | ICD-10-CM | POA: Insufficient documentation

## 2019-09-22 MED ORDER — CEPHALEXIN 500 MG PO CAPS: 500.0000 mg | ORAL_CAPSULE | Freq: Four times a day (QID) | ORAL | 0 refills | Status: DC

## 2019-09-22 NOTE — Telephone Encounter (Signed)
Patient called. Would like to speak with Terri. No reason given.

## 2019-09-22 NOTE — Telephone Encounter (Signed)
I called. She was checking on the appointment for her mammogram. Since she called here originally, she received a call from the radiology facility about the appointment. That was all she needed.

## 2019-09-22 NOTE — Progress Notes (Signed)
Office Visit Note   Patient: Shirley Hubbard           Date of Birth: 1961/09/13           MRN: [id number] Visit Date: 09/22/2019 Requested by: Eunice Blase, MD [address] [place],  [place] [address] PCP: Eunice Blase, MD  Subjective: Chief Complaint  Patient presents with  . lump in the left breast, swollen & painful x 1 week    HPI: She is here with concerns about her left breast.  She states that even though she is status post hysterectomy years ago, she still seems to have monthly hormone fluctuations.  During that time her breasts are usually tender but this month, her right one quit hurting but her left 1 continued to be painful and then she noticed a lump which has not gone away.  Denies fevers or chills.  She has had normal mammograms in the past, her last one was about a year ago.  No family history of breast cancer.               ROS:   All other systems were reviewed and are negative.  Objective: Vital Signs: There were no vitals taken for this visit.  Physical Exam: Female chaperone was present. General:  Alert and oriented, in no acute distress. Pulm:  Breathing unlabored. Psy:  Normal mood, congruent affect. Skin: There is slight erythema just below the areola on the left with a tender subcutaneous nodule measuring about 1.5 cm. Breasts:  Symmetric, no dimpling of skin.  No axilary adenopathy on left.  No other nodules palpable.  Imaging: None  Assessment & Plan: 1.  Left breast nodule - Mammogram. - Keflex in case there is infection.     Procedures: No procedures performed  No notes on file     PMFS History: Patient Active Problem List   Diagnosis Date Noted  . Carotid artery stenosis 09/22/2019  . OSA (obstructive sleep apnea) 09/21/2019  . History of 2019 novel coronavirus disease (COVID-19) 09/21/2019  . Shortness of breath 09/21/2019  . Excessive daytime sleepiness 08/06/2019  . Hematuria 04/09/2019  . Chronic nonintractable headache  04/09/2019  . Fecal occult blood test positive 03/12/2019  . Suspected sleep apnea 12/05/2017  . Snoring 08/24/2017  . IFG (impaired fasting glucose) 09/28/2016  . Mixed hyperlipidemia 09/28/2016  . Dystrophic nail 09/28/2016  . Acute midline low back pain without sciatica 08/22/2016  . Spondylosis of cervical region without myelopathy or radiculopathy 08/22/2016  . Thyroid nodule 07/27/2016  . Chronic pain syndrome 02/20/2016  . Anxiety 11/16/2015  . Benign essential hypertension 09/14/2015  . Iron deficiency anemia due to chronic blood loss 06/16/2015  . Trigeminal neuralgia of left side of face 06/16/2015  . Allergic contact dermatitis 05/28/2015  . Hematoma - postoperative 05/16/2011  . Mass of shoulder, L scapular area  04/17/2011   Past Medical History:  Diagnosis Date  . Hyperlipidemia   . Hypertension    on medication but cant remember the name  . Lipoma   . Seroma    axillary    Family History  Problem Relation Age of Onset  . Healthy Sister   . Healthy Brother   . Healthy Sister   . Healthy Sister   . Healthy Brother   . Healthy Brother   . Healthy Sister   . Healthy Brother     Past Surgical History:  Procedure Laterality Date  . ABDOMINAL HYSTERECTOMY  2000  . LIPOMA EXCISION  back  . seroma aspiration     Social History   Occupational History  . Not on file  Tobacco Use  . Smoking status: Never Smoker  . Smokeless tobacco: Never Used  Substance and Sexual Activity  . Alcohol use: No  . Drug use: No  . Sexual activity: Not on file

## 2019-10-01 ENCOUNTER — Ambulatory Visit
Admission: RE | Admit: 2019-10-01 | Discharge: 2019-10-01 | Disposition: A | Discharge status: Home / Self Care | Payer: Managed Care, Other (non HMO) | Source: Ambulatory Visit | Attending: Family Medicine | Admitting: Family Medicine

## 2019-10-01 ENCOUNTER — Telehealth: Payer: Self-pay | Admitting: Family Medicine

## 2019-10-01 ENCOUNTER — Other Ambulatory Visit: Payer: Self-pay

## 2019-10-01 DIAGNOSIS — N6320 Unspecified lump in the left breast, unspecified quadrant: Secondary | ICD-10-CM

## 2019-10-01 IMAGING — MG DIGITAL DIAGNOSTIC BILAT W/ TOMO W/ CAD
8 of 14 series · 8 of 40 positions shown · non-contrast
Comparison: Previous exam(s).
COMPARISON: Previous exam(s).

Addendum:
CLINICAL DATA: The patient recently developed signs of infection in
the left breast. Her physician prescribed antibiotics which the
patient did not take. Overall, her symptoms have improved but there
is a focal region of tenderness remaining.

EXAM:
DIGITAL DIAGNOSTIC BILATERAL MAMMOGRAM WITH CAD AND TOMO
ULTRASOUND LEFT BREAST

[L ML synth-2D]
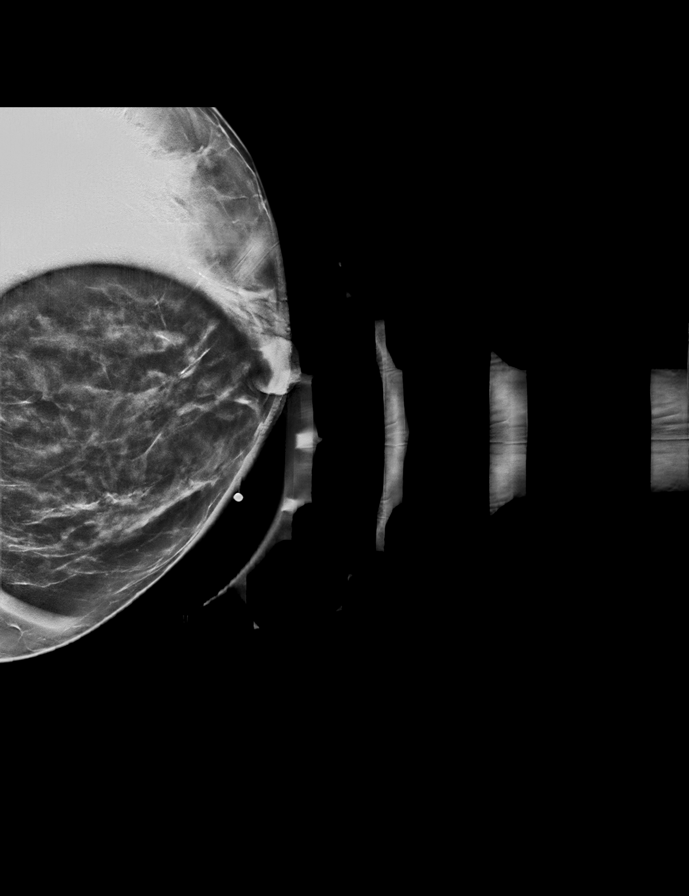

[R CC synth-2D (1 of 2)]
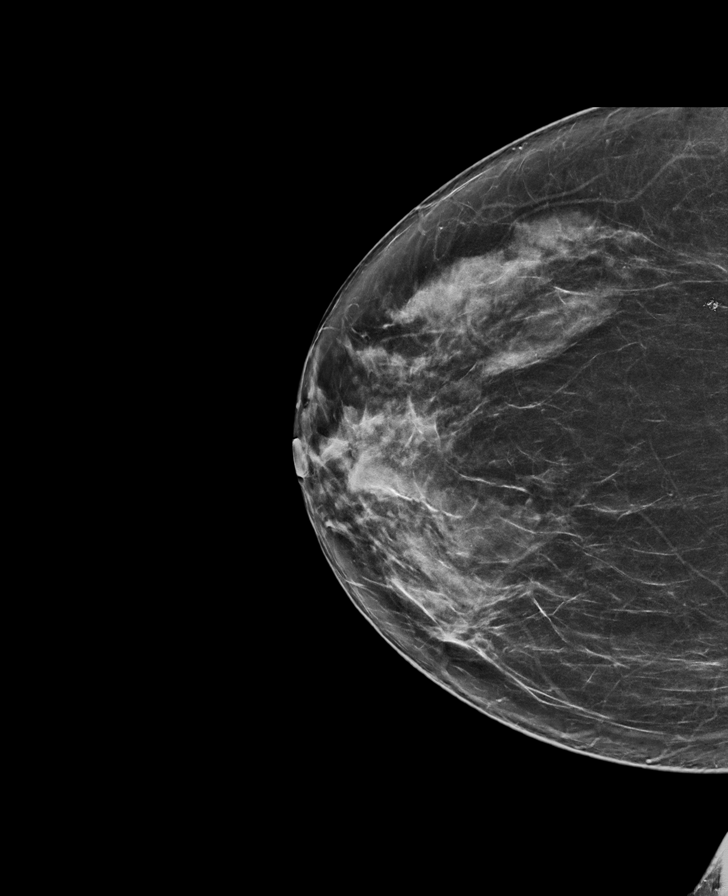

[L MLO synth-2D]
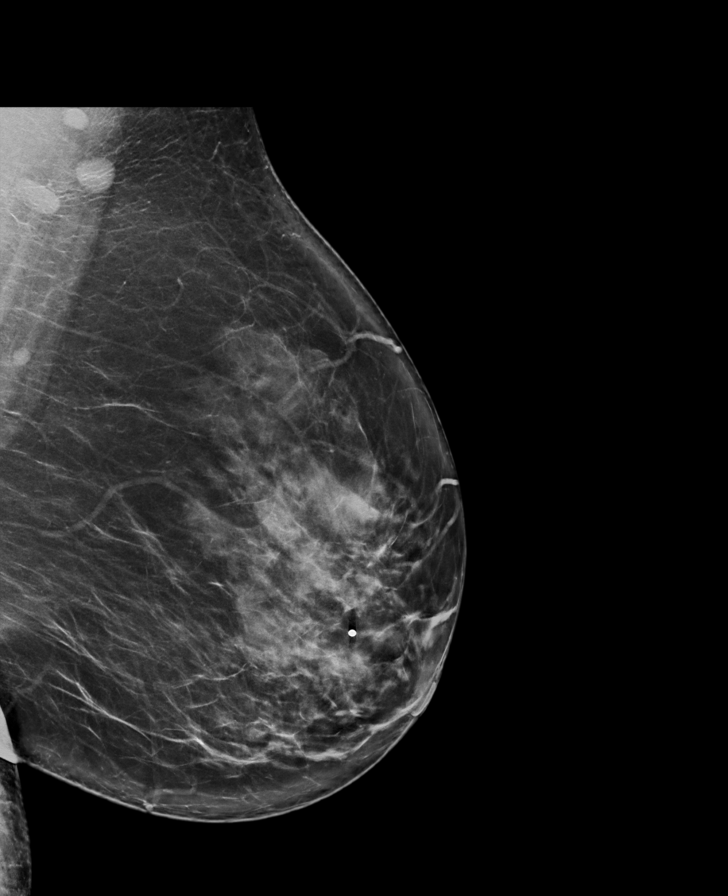

[R MLO synth-2D (1 of 2)]
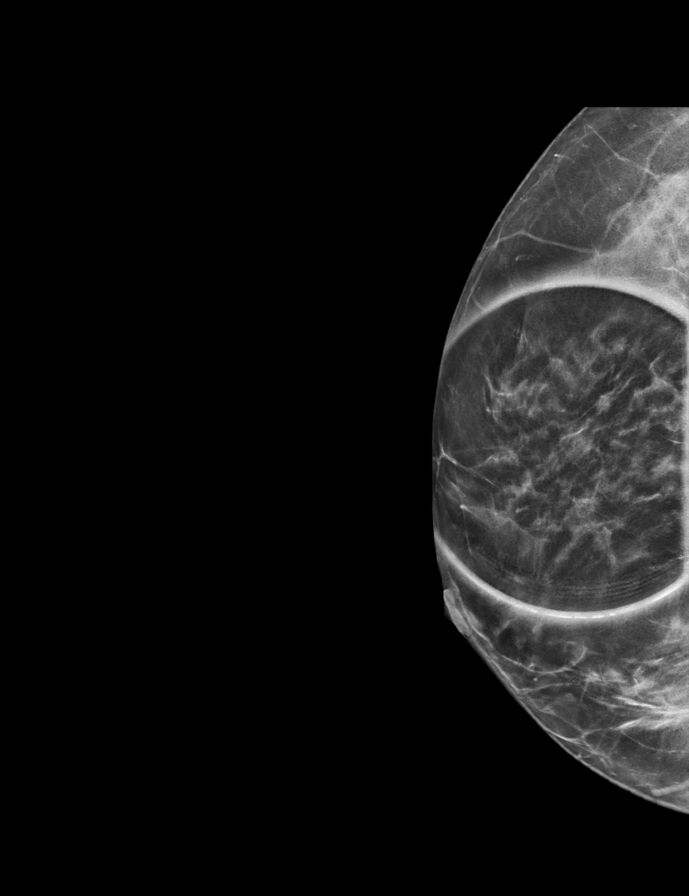

[R CC synth-2D (2 of 2)]
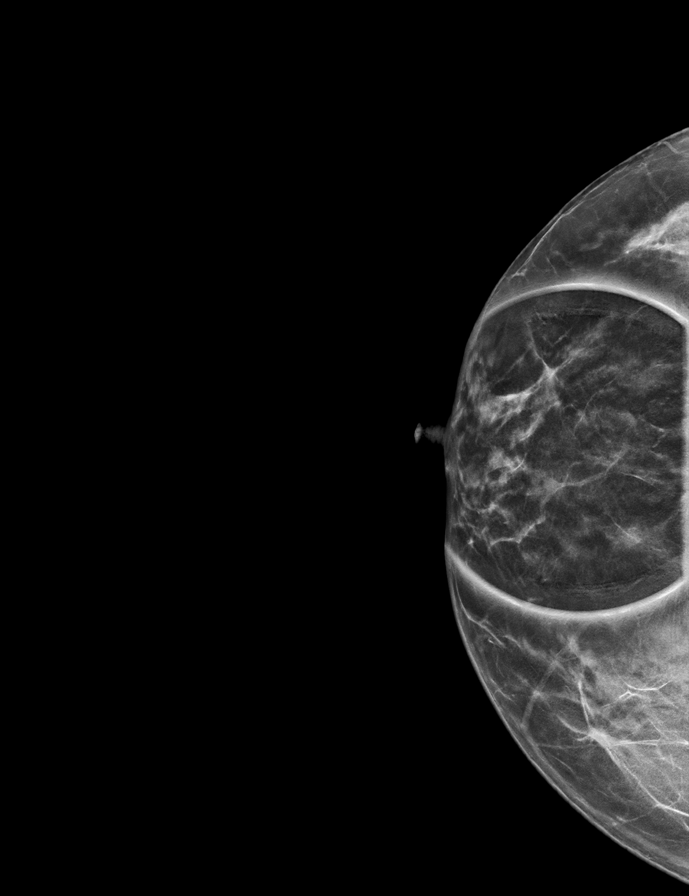

[R MLO synth-2D (2 of 2)]
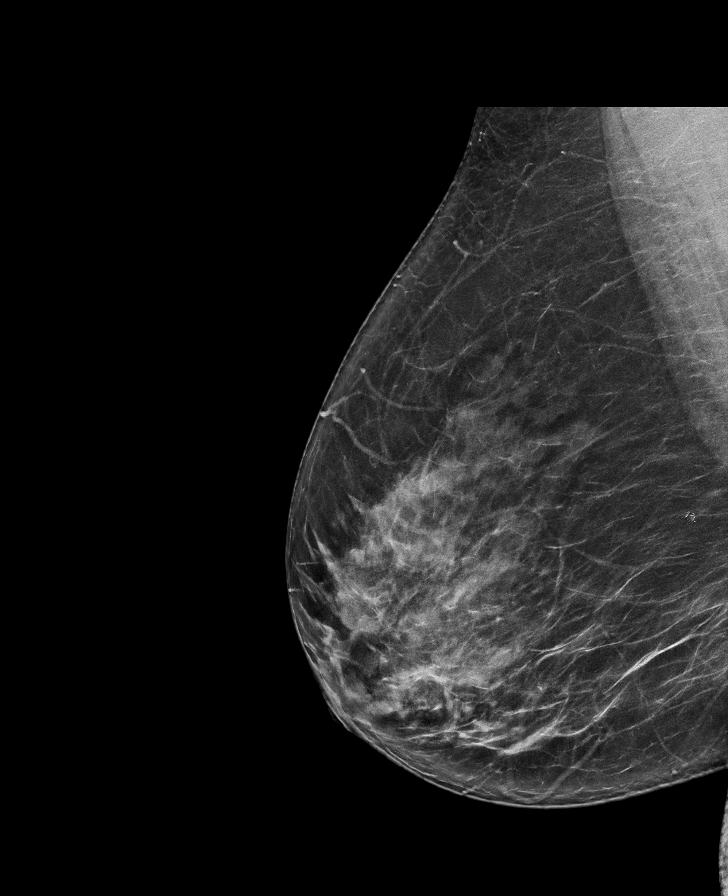

[L CC synth-2D]
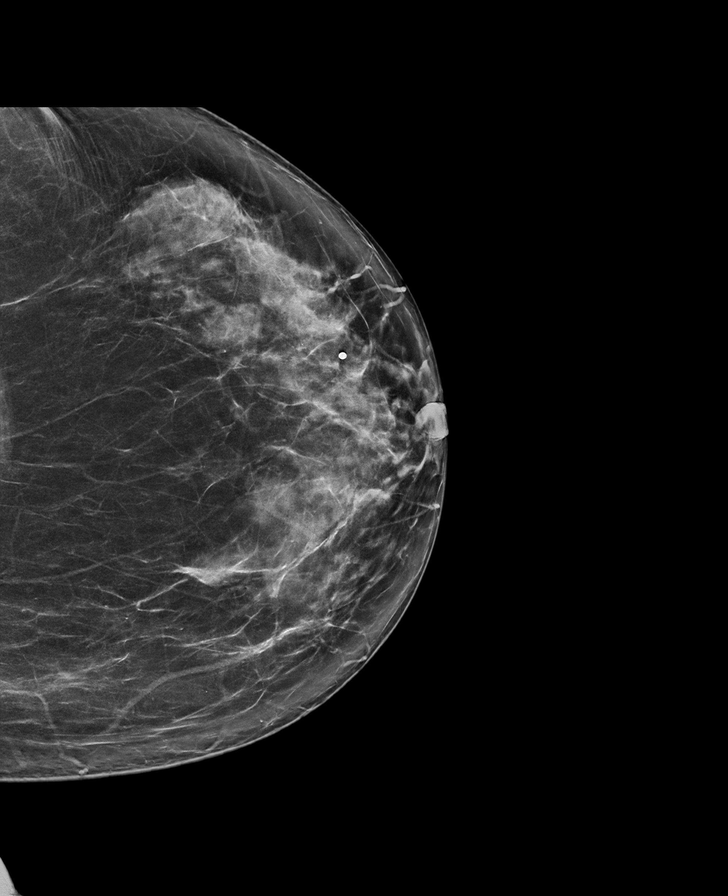

[L MLO tomo · tomo slice 45/88.0]
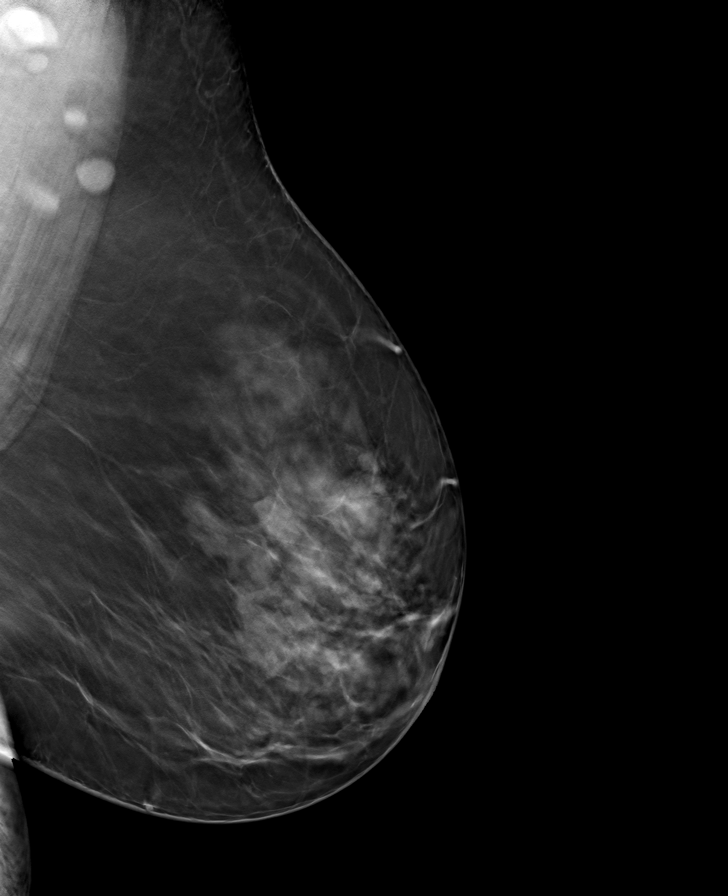

[8 of 40 positions shown; findings below may reference images not displayed]

ACR Breast Density Category c: The breast tissue is heterogeneously
dense, which may obscure small masses.
FINDINGS: No suspicious masses, calcifications, or distortion are identified
bilaterally.

Mammographic images were processed with CAD.

On physical exam, there is mild erythema and skin thickening in the
left breast at [DATE], 2 cm from the nipple.

Targeted ultrasound is performed, showing focal skin thickening in
the left breast at [DATE], 2 cm from the nipple. The finding is
entirely contained within the skin. No abnormalities are seen within
the breast. There is an oval region of more prominent decreased
echogenicity measuring 1.4 cm within the thickened skin.
IMPRESSION: The patient's symptoms are due to focal skin thickening and
consistent with a skin related process. No evidence of breast
cancer. The 1.4 cm region of decreased echogenicity within the
thickened skin may represent a sebaceous cyst or focal edema.

RECOMMENDATION:
Recommend antibiotic therapy with the antibiotics prescribed by the
patient's physician. Recommend clinical follow-up to complete
clinical resolution.

I have discussed the findings and recommendations with the patient.
If applicable, a reminder letter will be sent to the patient
regarding the next appointment.

BI-RADS CATEGORY  2: Benign.

ADDENDUM:
Recommendation: Annual screening mammography.

*** End of Addendum ***
ACR Breast Density Category c: The breast tissue is heterogeneously
dense, which may obscure small masses.
FINDINGS: No suspicious masses, calcifications, or distortion are identified
bilaterally.

Mammographic images were processed with CAD.

On physical exam, there is mild erythema and skin thickening in the
left breast at [DATE], 2 cm from the nipple.

Targeted ultrasound is performed, showing focal skin thickening in
the left breast at [DATE], 2 cm from the nipple. The finding is
entirely contained within the skin. No abnormalities are seen within
the breast. There is an oval region of more prominent decreased
echogenicity measuring 1.4 cm within the thickened skin.
IMPRESSION: The patient's symptoms are due to focal skin thickening and
consistent with a skin related process. No evidence of breast
cancer. The 1.4 cm region of decreased echogenicity within the
thickened skin may represent a sebaceous cyst or focal edema.

RECOMMENDATION:
Recommend antibiotic therapy with the antibiotics prescribed by the
patient's physician. Recommend clinical follow-up to complete
clinical resolution.

I have discussed the findings and recommendations with the patient.
If applicable, a reminder letter will be sent to the patient
regarding the next appointment.

BI-RADS CATEGORY  2: Benign.

## 2019-10-01 IMAGING — US US BREAST*L* LIMITED INC AXILLA
1 series · 7 of 7 positions shown · non-contrast
Comparison: Previous exam(s).
COMPARISON: Previous exam(s).

Addendum:
CLINICAL DATA: The patient recently developed signs of infection in
the left breast. Her physician prescribed antibiotics which the
patient did not take. Overall, her symptoms have improved but there
is a focal region of tenderness remaining.

EXAM:
DIGITAL DIAGNOSTIC BILATERAL MAMMOGRAM WITH CAD AND TOMO
ULTRASOUND LEFT BREAST

[Series 1: us breast*left* limited inc axilla · 0.06mm/px · 7 of 7 slices shown]
[im 1/7]
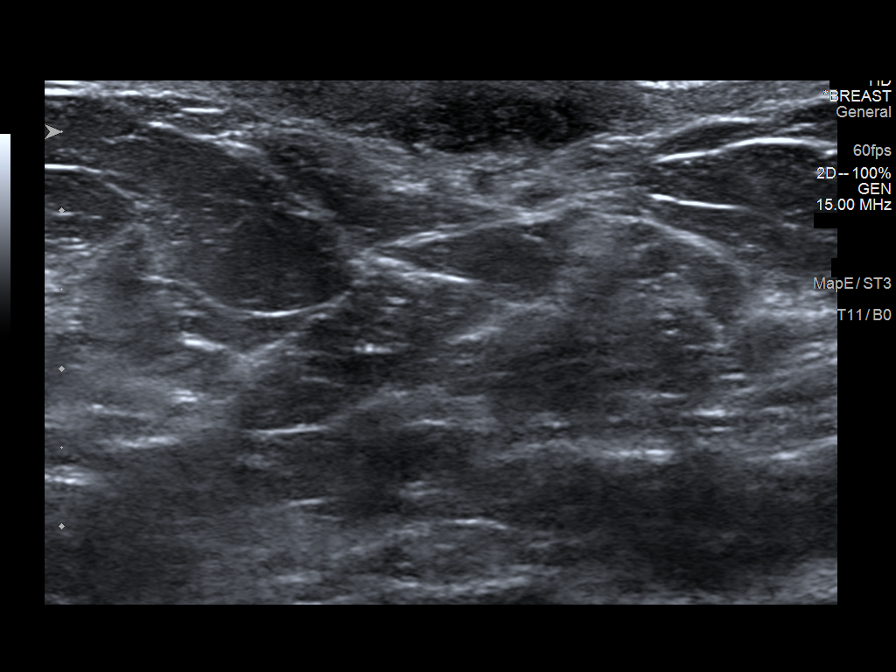
[im 2/7]
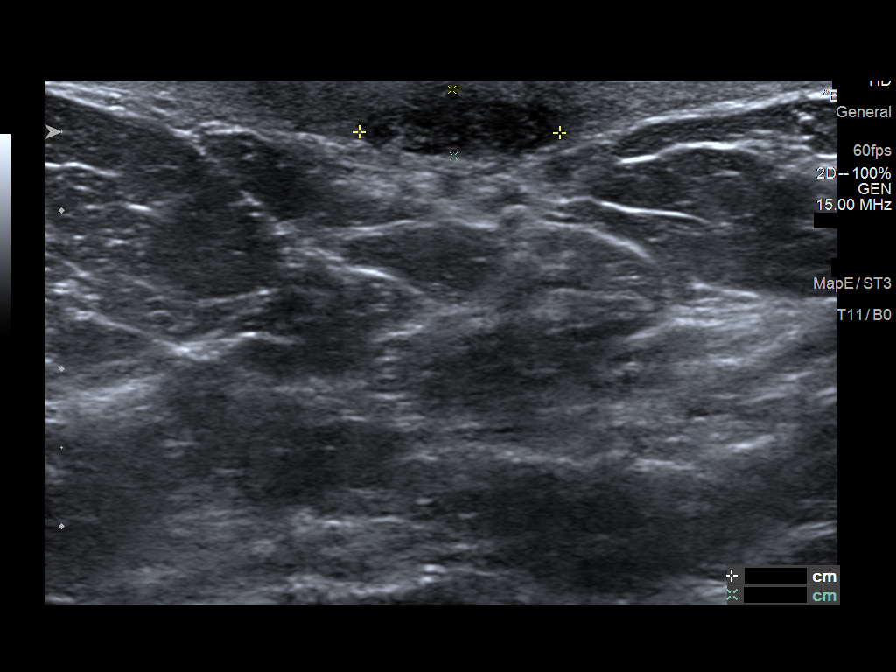
[im 3/7]
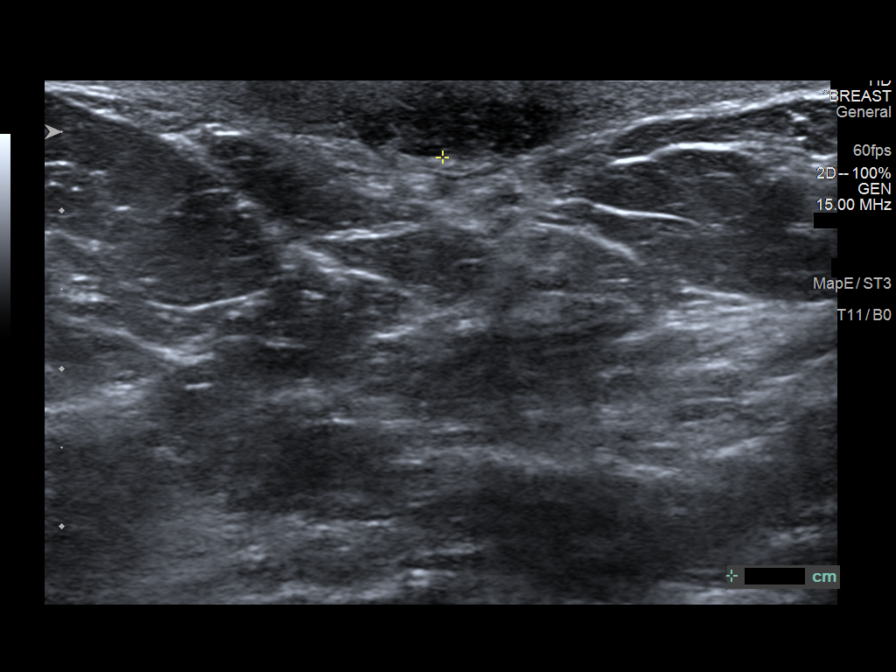
[im 4/7]
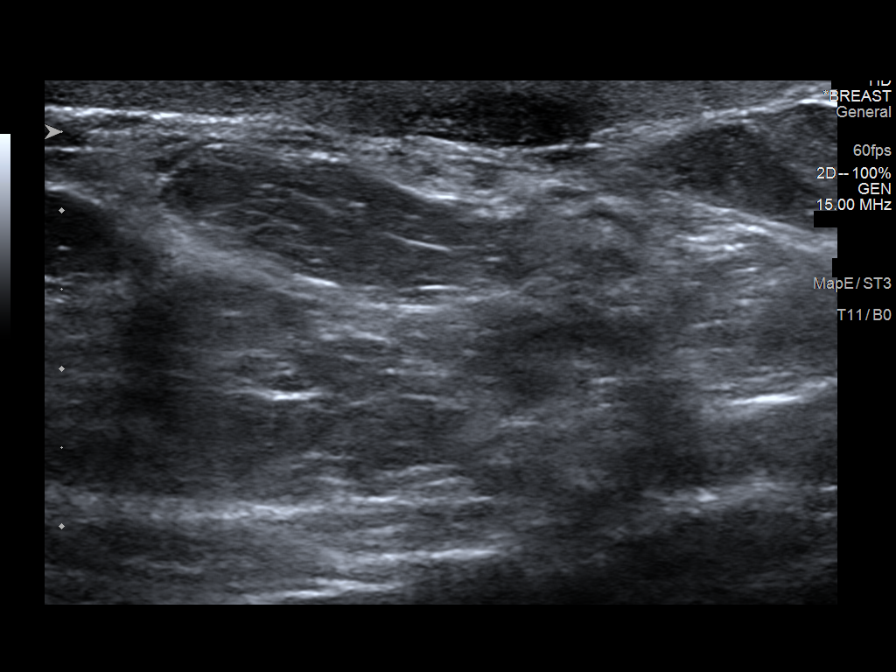
[im 5/7]
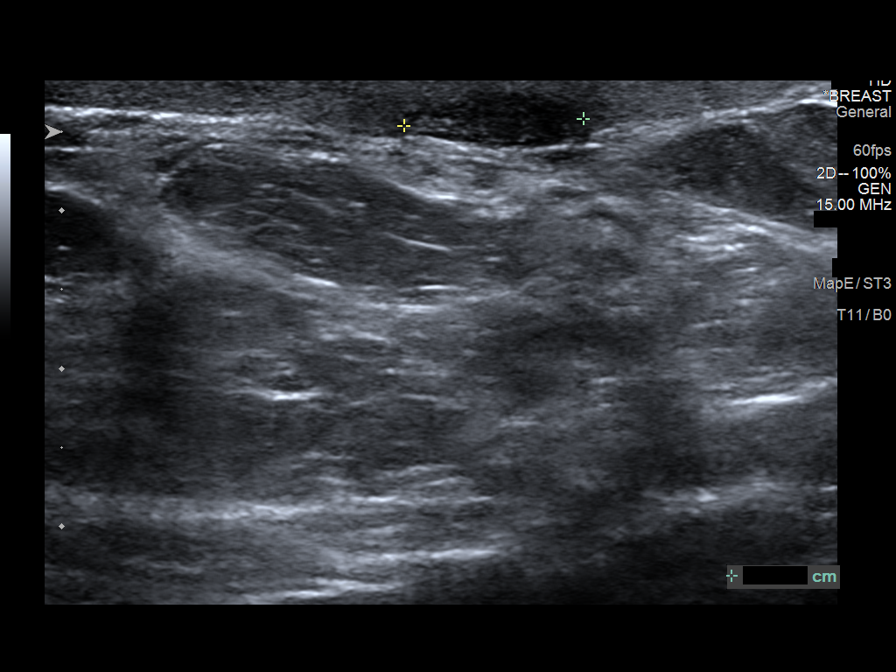
[im 6/7]
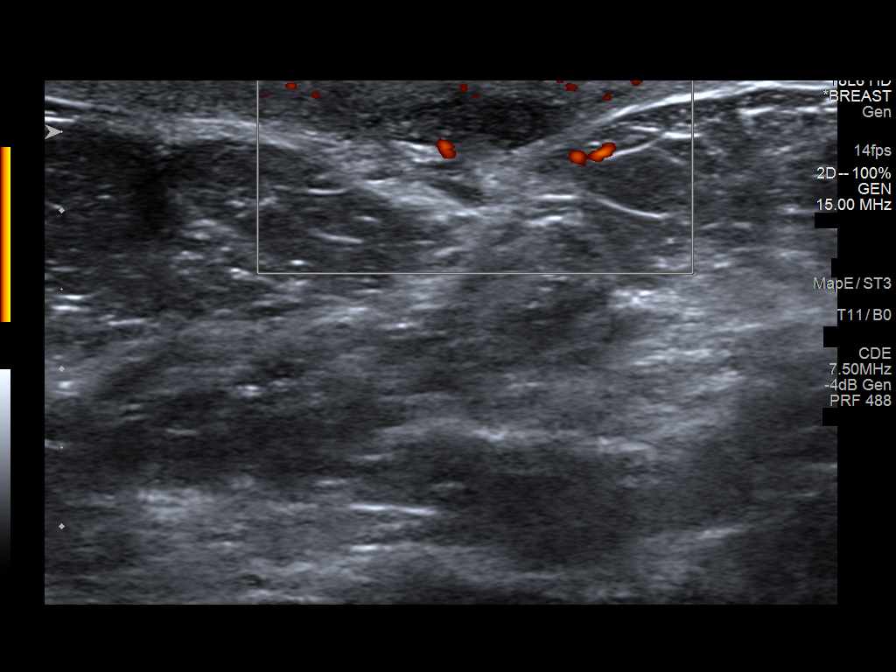
[im 7/7]
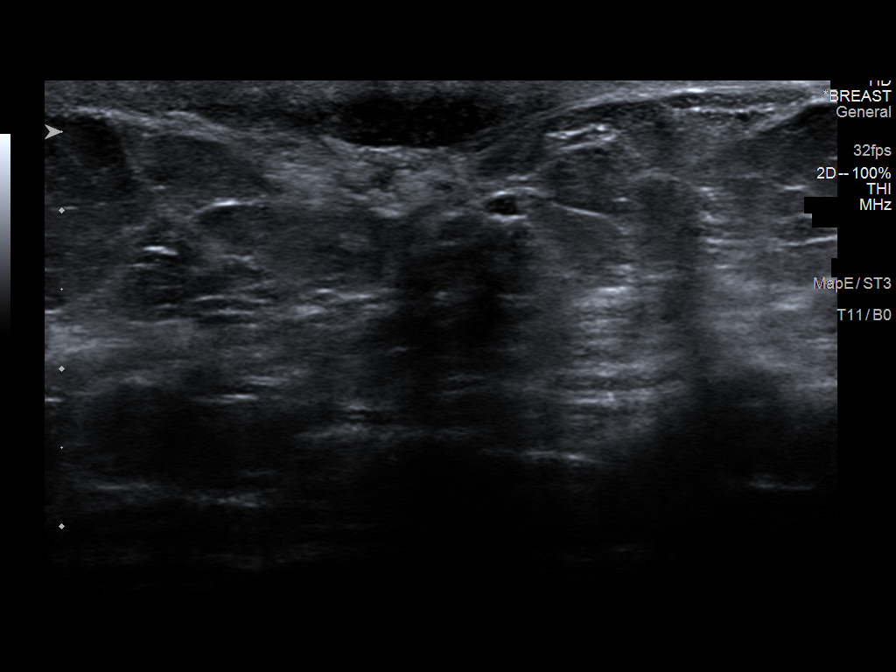

[7 of 7 positions shown; findings below may reference images not displayed]

ACR Breast Density Category c: The breast tissue is heterogeneously
dense, which may obscure small masses.
FINDINGS: No suspicious masses, calcifications, or distortion are identified
bilaterally.

Mammographic images were processed with CAD.

On physical exam, there is mild erythema and skin thickening in the
left breast at [DATE], 2 cm from the nipple.

Targeted ultrasound is performed, showing focal skin thickening in
the left breast at [DATE], 2 cm from the nipple. The finding is
entirely contained within the skin. No abnormalities are seen within
the breast. There is an oval region of more prominent decreased
echogenicity measuring 1.4 cm within the thickened skin.
IMPRESSION: The patient's symptoms are due to focal skin thickening and
consistent with a skin related process. No evidence of breast
cancer. The 1.4 cm region of decreased echogenicity within the
thickened skin may represent a sebaceous cyst or focal edema.

RECOMMENDATION:
Recommend antibiotic therapy with the antibiotics prescribed by the
patient's physician. Recommend clinical follow-up to complete
clinical resolution.

I have discussed the findings and recommendations with the patient.
If applicable, a reminder letter will be sent to the patient
regarding the next appointment.

BI-RADS CATEGORY  2: Benign.

ADDENDUM:
Recommendation: Annual screening mammography.

*** End of Addendum ***
ACR Breast Density Category c: The breast tissue is heterogeneously
dense, which may obscure small masses.
FINDINGS: No suspicious masses, calcifications, or distortion are identified
bilaterally.

Mammographic images were processed with CAD.

On physical exam, there is mild erythema and skin thickening in the
left breast at [DATE], 2 cm from the nipple.

Targeted ultrasound is performed, showing focal skin thickening in
the left breast at [DATE], 2 cm from the nipple. The finding is
entirely contained within the skin. No abnormalities are seen within
the breast. There is an oval region of more prominent decreased
echogenicity measuring 1.4 cm within the thickened skin.
IMPRESSION: The patient's symptoms are due to focal skin thickening and
consistent with a skin related process. No evidence of breast
cancer. The 1.4 cm region of decreased echogenicity within the
thickened skin may represent a sebaceous cyst or focal edema.

RECOMMENDATION:
Recommend antibiotic therapy with the antibiotics prescribed by the
patient's physician. Recommend clinical follow-up to complete
clinical resolution.

I have discussed the findings and recommendations with the patient.
If applicable, a reminder letter will be sent to the patient
regarding the next appointment.

BI-RADS CATEGORY  2: Benign.

## 2019-10-01 NOTE — Telephone Encounter (Signed)
Mammogram looks good - no sign of cancer.  The area in question seems to be a skin infection, so hopefully it will resolve with the antibiotics.

## 2019-10-01 NOTE — Telephone Encounter (Signed)
I left message on the patient's mobile voice mail.

## 2019-10-02 ENCOUNTER — Telehealth: Payer: Self-pay | Admitting: Pulmonary Disease

## 2019-10-02 NOTE — Telephone Encounter (Signed)
Spoke with the pt  She is wanting her work note extended  She states with her current note, her employer is expecting her to return to work on 10/13/19   Here is the last note:  To Whom It May Concern:    The above named patient has medical conditions that increase their risk of poor health outcomes if they contract the Novel Coronavirus (COVID-19) infection. It is my medical opinion they be allowed to work from home or be excused from work henceforth until it is safe to resume normal duties.   Please excuse Shirley Hubbard from work until October 13, 2019  so that they may comply with this directive.   Do not hesitate to call us should you have any question or concern.   She has rov with PFT here on 10/13/19  Please advise thanks!

## 2019-10-02 NOTE — Telephone Encounter (Signed)
LMTCB

## 2019-10-02 NOTE — Telephone Encounter (Signed)
Patient is returning phone call. Patient phone number is [phone]. 

## 2019-10-07 NOTE — Telephone Encounter (Signed)
Returning call CB# [phone]

## 2019-10-07 NOTE — Telephone Encounter (Signed)
Ok to extend work note to 10/27/2019 due to ongoing respiratory symptoms related to her medical condition. Please provide note for patient to pick up later today.  Rodman Pickle, M.D. [facility] 10/07/2019 3:21 PM

## 2019-10-07 NOTE — Telephone Encounter (Signed)
Work note done as per Dr Loanne Drilling [facility] TCB x1 Letter pended to see if patient would like it printed or sent to her MyChart

## 2019-10-08 NOTE — Telephone Encounter (Signed)
Spoke with pt, she would like to come pick up the letter. I advised pt that I would leave the letter up front for her to pick up. Nothing further is needed.

## 2019-10-10 ENCOUNTER — Other Ambulatory Visit (HOSPITAL_COMMUNITY)
Admission: RE | Admit: 2019-10-10 | Discharge: 2019-10-10 | Disposition: A | Discharge status: Home / Self Care | Payer: Managed Care, Other (non HMO) | Source: Ambulatory Visit | Attending: Pulmonary Disease | Admitting: Pulmonary Disease

## 2019-10-10 DIAGNOSIS — Z20828 Contact with and (suspected) exposure to other viral communicable diseases: Secondary | ICD-10-CM | POA: Insufficient documentation

## 2019-10-10 DIAGNOSIS — Z01812 Encounter for preprocedural laboratory examination: Secondary | ICD-10-CM | POA: Diagnosis present

## 2019-10-10 LAB — SARS CORONAVIRUS 2 (TAT 6-24 HRS): SARS Coronavirus 2: NEGATIVE

## 2019-10-13 ENCOUNTER — Telehealth: Payer: Self-pay | Admitting: Pulmonary Disease

## 2019-10-13 ENCOUNTER — Ambulatory Visit (INDEPENDENT_AMBULATORY_CARE_PROVIDER_SITE_OTHER): Payer: Managed Care, Other (non HMO) | Admitting: Pulmonary Disease

## 2019-10-13 ENCOUNTER — Ambulatory Visit (INDEPENDENT_AMBULATORY_CARE_PROVIDER_SITE_OTHER): Payer: Managed Care, Other (non HMO) | Admitting: Primary Care

## 2019-10-13 ENCOUNTER — Other Ambulatory Visit: Payer: Self-pay

## 2019-10-13 ENCOUNTER — Encounter: Payer: Self-pay | Admitting: Primary Care

## 2019-10-13 VITALS — BP 120/76 | HR 70 | Temp 97.3°F | Ht 63.0 in | Wt 207.0 lb

## 2019-10-13 DIAGNOSIS — R0602 Shortness of breath: Secondary | ICD-10-CM

## 2019-10-13 DIAGNOSIS — J383 Other diseases of vocal cords: Secondary | ICD-10-CM | POA: Diagnosis not present

## 2019-10-13 DIAGNOSIS — R053 Chronic cough: Secondary | ICD-10-CM

## 2019-10-13 DIAGNOSIS — J984 Other disorders of lung: Secondary | ICD-10-CM | POA: Diagnosis not present

## 2019-10-13 DIAGNOSIS — R05 Cough: Secondary | ICD-10-CM

## 2019-10-13 DIAGNOSIS — G4733 Obstructive sleep apnea (adult) (pediatric): Secondary | ICD-10-CM | POA: Diagnosis not present

## 2019-10-13 LAB — PULMONARY FUNCTION TEST
DL/VA % pred: 104 %
DL/VA: 4.42 ml/min/mmHg/L
DLCO unc % pred: 72 %
DLCO unc: 14.13 ml/min/mmHg
FEF 25-75 Post: 3.56 L/s
FEF 25-75 Pre: 2.05 L/s
FEF2575-%Change-Post: 73 %
FEF2575-%Pred-Post: 177 %
FEF2575-%Pred-Pre: 102 %
FEV1-%Change-Post: 21 %
FEV1-%Pred-Post: 83 %
FEV1-%Pred-Pre: 68 %
FEV1-Post: 1.67 L
FEV1-Pre: 1.37 L
FEV1FVC-%Change-Post: 2 %
FEV1FVC-%Pred-Pre: 111 %
FEV6-%Change-Post: 19 %
FEV6-%Pred-Post: 75 %
FEV6-%Pred-Pre: 63 %
FEV6-Post: 1.85 L
FEV6-Pre: 1.55 L
FEV6FVC-%Pred-Post: 103 %
FEV6FVC-%Pred-Pre: 103 %
FVC-%Change-Post: 19 %
FVC-%Pred-Post: 72 %
FVC-%Pred-Pre: 60 %
FVC-Post: 1.85 L
FVC-Pre: 1.55 L
Post FEV1/FVC ratio: 90 %
Post FEV6/FVC ratio: 100 %
Pre FEV1/FVC ratio: 88 %
Pre FEV6/FVC Ratio: 100 %
RV % pred: 69 %
RV: 1.34 L
TLC % pred: 72 %
TLC: 3.54 L

## 2019-10-13 MED ORDER — OMEPRAZOLE 20 MG PO CPDR: 20.0000 mg | DELAYED_RELEASE_CAPSULE | Freq: Every day | ORAL | 0 refills | Status: AC

## 2019-10-13 MED ORDER — FLUTICASONE PROPIONATE 50 MCG/ACT NA SUSP: 1.0000 | Freq: Every day | NASAL | 0 refills | Status: AC

## 2019-10-13 NOTE — Assessment & Plan Note (Addendum)
-   Moderate restriction on PFTs with no obstruction. +BD response. 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

## 2019-10-13 NOTE — Assessment & Plan Note (Signed)
-   Reports daytime fatigue - HST in November showed Mild OSA, AHI 13.3 - Start auto CPAP 5-15cm H20 with mask of choice and supplies  - Needs to establish with sleep medicine, recommend 6-8 week follow-up with download

## 2019-10-13 NOTE — Progress Notes (Signed)
PFT done today. 

## 2019-10-13 NOTE — Patient Instructions (Addendum)
  Recommendations: Continue Breo 1 puff once daily  RX: Omeprazole 20mg  once daily- take 30 mins before breakfast (trial 4 weeks) Flonase nasal spray once daily (trial 4 weeks)  Orders: Auto CPAP 5-15cm h20, mask of choice, supplies and humidification (for sleep apnea)  Referral: ENT for possible vocal cord dysfunction and OSA   Follow-up: 4 week televisit with Dr. Loanne Drilling or Beth  6-8 weeks with sleep doctor in office with download

## 2019-10-13 NOTE — Progress Notes (Signed)
@Patient  ID: Shirley Hubbard, female    DOB: [birth year], 58 y.o.   MRN: [id number]  Chief Complaint  Patient presents with  . Follow-up    Patient is here for PFT and ROV. Patient is having shortness of breath with exertion.     Referring provider: Eunice Blase, MD  HPI: 58 year old female, never smoked. PMH significant for Covid -19 (August 2020), OSA, HTN, carotid artery stenosis, thyroid nodules. Patient of Dr. Loanne Drilling, last seen on 09/10/19 for chronic shortness of breath. She was started on Breo in October. CTA was negative and sleep study showed mild OSA with AHI 13.3. Recommended she be started on Auto CPAP, not received.   10/13/2019 Patient presents today for 1 month follow-up with PFTs. She has noticed an improvement I her breathing with addition of Breo Ellipta 200 once daily. Continues to have dyspnea on exertion and fatigue. States that her husband has said that he can hear her wheezing at times. She has a chronic dry cough and nasal congestion. Reports voice hoarseness and hurts when she swallows. Continues to have persistent fatigue. She was never started on CPAP.  Denies obvious heat burn symptoms, aspiration, choking or difficulty swallowing.  Data Reviewed:  Imaging: CXR  07/08/19 - no infiltrate, edema or effusion  PFT: 10/13/2019 - FVC 1.85 (72%), FEV1 1.67 (83%), ratio 90, +BD response Flow loop suggesting VCD   Sleep study: 08/29/19- Mild OSA with an AHI of 13.3 and SpO2 low of 82%.    No Known Allergies  Immunization History  Administered Date(s) Administered  . Tdap 03/23/2008, 07/10/2018    Past Medical History:  Diagnosis Date  . Hyperlipidemia   . Hypertension    on medication but cant remember the name  . Lipoma   . Seroma    axillary    Tobacco History: Social History   Tobacco Use  Smoking Status Never Smoker  Smokeless Tobacco Never Used   Counseling given: Not Answered   Outpatient Medications Prior to Visit    Medication Sig Dispense Refill  . acetaminophen (TYLENOL) 500 MG tablet Take by mouth.    Marland Kitchen albuterol (VENTOLIN HFA) 108 (90 Base) MCG/ACT inhaler Inhale 2 puffs into the lungs every 6 (six) hours as needed for wheezing or shortness of breath. 18 g 3  . aspirin EC 81 MG tablet Take by mouth.    Marland Kitchen atorvastatin (LIPITOR) 40 MG tablet TAKE ONE TABLET BY MOUTH EVERY NIGHT AT BEDTIME    . candesartan (ATACAND) 32 MG tablet TAKE ONE TABLET BY MOUTH DAILY    . ferrous sulfate 325 (65 FE) MG tablet Take by mouth.    . fluticasone furoate-vilanterol (BREO ELLIPTA) 200-25 MCG/INH AEPB Inhale 1 puff into the lungs daily. 1 each 3  . fluticasone furoate-vilanterol (BREO ELLIPTA) 200-25 MCG/INH AEPB Inhale 1 puff into the lungs daily. 28 each 0  . HYDROcodone-homatropine (HYCODAN) 5-1.5 MG/5ML syrup Take 5 mLs by mouth every 6 (six) hours as needed for cough. 240 mL 0  . Multiple Vitamin (MULTI-VITAMIN) tablet Take by mouth.    . cephALEXin (KEFLEX) 500 MG capsule Take 1 capsule (500 mg total) by mouth 4 (four) times daily. 28 capsule 0   No facility-administered medications prior to visit.   Review of Systems  Review of Systems  Constitutional: Negative.   HENT: Positive for postnasal drip.   Respiratory: Positive for apnea, cough, shortness of breath and wheezing. Negative for choking and stridor.   Cardiovascular: Negative.    Physical  Exam  BP 120/76 (BP Location: Left Arm, Patient Position: Sitting, Cuff Size: Normal)   Pulse 70   Temp (!) 97.3 F (36.3 C) (Temporal)   Ht 5\' 3"  (1.6 m)   Wt 207 lb (93.9 kg)   SpO2 94% Comment: on RA  BMI 36.67 kg/m  Physical Exam Constitutional:      Appearance: Normal appearance.  HENT:     Head: Normocephalic and atraumatic.     Mouth/Throat:     Comments: Deferred d/t masking Cardiovascular:     Rate and Rhythm: Normal rate and regular rhythm.  Pulmonary:     Effort: Pulmonary effort is normal. No respiratory distress.     Breath sounds:  Normal breath sounds. No stridor. No wheezing or rhonchi.  Musculoskeletal:     Cervical back: Normal range of motion and neck supple.  Skin:    General: Skin is warm and dry.  Neurological:     General: No focal deficit present.     Mental Status: She is alert and oriented to person, place, and time. Mental status is at baseline.  Psychiatric:        Mood and Affect: Mood normal.        Behavior: Behavior normal.        Thought Content: Thought content normal.        Judgment: Judgment normal.      Lab Results:  CBC    Component Value Date/Time   WBC 5.7 06/25/2019 0919   RBC 4.35 06/25/2019 0919   HGB 12.4 06/25/2019 0919   HCT 39.7 06/25/2019 0919   PLT 203 06/25/2019 0919   MCV 91.3 06/25/2019 0919   MCH 28.5 06/25/2019 0919   MCHC 31.2 06/25/2019 0919   RDW 13.1 06/25/2019 0919    BMET    Component Value Date/Time   NA 142 08/05/2019 1056   K 3.7 08/05/2019 1056   CL 106 08/05/2019 1056   CO2 29 08/05/2019 1056   GLUCOSE 97 08/05/2019 1056   BUN 16 08/05/2019 1056   CREATININE 0.79 08/05/2019 1056   CALCIUM 9.7 08/05/2019 1056   GFRNONAA >60 06/25/2019 0919   GFRAA >60 06/25/2019 0919    BNP No results found for: BNP  ProBNP No results found for: PROBNP  Imaging: US BREAST LTD UNI LEFT INC AXILLA  Addendum Date: 10/01/2019   ADDENDUM REPORT: 10/01/2019 13:12 ADDENDUM: Recommendation: Annual screening mammography. Electronically Signed   By: Dorise Bullion III M.D   On: 10/01/2019 13:12   Result Date: 10/01/2019 CLINICAL DATA:  The patient recently developed signs of infection in the left breast. Her physician prescribed antibiotics which the patient did not take. Overall, her symptoms have improved but there is a focal region of tenderness remaining. EXAM: DIGITAL DIAGNOSTIC BILATERAL MAMMOGRAM WITH CAD AND TOMO ULTRASOUND LEFT BREAST COMPARISON:  Previous exam(s). ACR Breast Density Category c: The breast tissue is heterogeneously dense, which may  obscure small masses. FINDINGS: No suspicious masses, calcifications, or distortion are identified bilaterally. Mammographic images were processed with CAD. On physical exam, there is mild erythema and skin thickening in the left breast at 3:30, 2 cm from the nipple. Targeted ultrasound is performed, showing focal skin thickening in the left breast at 3:30, 2 cm from the nipple. The finding is entirely contained within the skin. No abnormalities are seen within the breast. There is an oval region of more prominent decreased echogenicity measuring 1.4 cm within the thickened skin. IMPRESSION: The patient's symptoms are due to focal  skin thickening and consistent with a skin related process. No evidence of breast cancer. The 1.4 cm region of decreased echogenicity within the thickened skin may represent a sebaceous cyst or focal edema. RECOMMENDATION: Recommend antibiotic therapy with the antibiotics prescribed by the patient's physician. Recommend clinical follow-up to complete clinical resolution. I have discussed the findings and recommendations with the patient. If applicable, a reminder letter will be sent to the patient regarding the next appointment. BI-RADS CATEGORY  2: Benign. Electronically Signed: By: Dorise Bullion III M.D On: 10/01/2019 10:35   MM DIAG BREAST TOMO BILATERAL  Addendum Date: 10/01/2019   ADDENDUM REPORT: 10/01/2019 13:12 ADDENDUM: Recommendation: Annual screening mammography. Electronically Signed   By: Dorise Bullion III M.D   On: 10/01/2019 13:12   Result Date: 10/01/2019 CLINICAL DATA:  The patient recently developed signs of infection in the left breast. Her physician prescribed antibiotics which the patient did not take. Overall, her symptoms have improved but there is a focal region of tenderness remaining. EXAM: DIGITAL DIAGNOSTIC BILATERAL MAMMOGRAM WITH CAD AND TOMO ULTRASOUND LEFT BREAST COMPARISON:  Previous exam(s). ACR Breast Density Category c: The breast tissue is  heterogeneously dense, which may obscure small masses. FINDINGS: No suspicious masses, calcifications, or distortion are identified bilaterally. Mammographic images were processed with CAD. On physical exam, there is mild erythema and skin thickening in the left breast at 3:30, 2 cm from the nipple. Targeted ultrasound is performed, showing focal skin thickening in the left breast at 3:30, 2 cm from the nipple. The finding is entirely contained within the skin. No abnormalities are seen within the breast. There is an oval region of more prominent decreased echogenicity measuring 1.4 cm within the thickened skin. IMPRESSION: The patient's symptoms are due to focal skin thickening and consistent with a skin related process. No evidence of breast cancer. The 1.4 cm region of decreased echogenicity within the thickened skin may represent a sebaceous cyst or focal edema. RECOMMENDATION: Recommend antibiotic therapy with the antibiotics prescribed by the patient's physician. Recommend clinical follow-up to complete clinical resolution. I have discussed the findings and recommendations with the patient. If applicable, a reminder letter will be sent to the patient regarding the next appointment. BI-RADS CATEGORY  2: Benign. Electronically Signed: By: Dorise Bullion III M.D On: 10/01/2019 10:35     Assessment & Plan:   Restrictive lung disease - Moderate restriction on PFTs with no obstruction. +BD response. 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

## 2019-10-13 NOTE — Assessment & Plan Note (Addendum)
-   Dry cough x 3 months - Trial omeprazole 20mg  daily and flonase nasal spray x 1 months (RX sent, no refills) - Refer to ENT for possible VCD seen on PFTs  - FU 4 weeks televisit

## 2019-10-14 NOTE — Telephone Encounter (Signed)
Seems like encounter was open in error so closing encounter.  

## 2019-10-21 ENCOUNTER — Telehealth: Payer: Self-pay | Admitting: Pulmonary Disease

## 2019-10-23 NOTE — Telephone Encounter (Signed)
Rec'd completed paperwork - Fwd to [facility] via interoffice mail -pr  °

## 2019-11-13 ENCOUNTER — Other Ambulatory Visit: Payer: Self-pay

## 2019-11-13 ENCOUNTER — Ambulatory Visit (INDEPENDENT_AMBULATORY_CARE_PROVIDER_SITE_OTHER): Payer: Managed Care, Other (non HMO) | Admitting: Pulmonary Disease

## 2019-11-13 ENCOUNTER — Encounter: Payer: Self-pay | Admitting: Pulmonary Disease

## 2019-11-13 ENCOUNTER — Encounter: Payer: Self-pay | Admitting: *Deleted

## 2019-11-13 ENCOUNTER — Encounter (HOSPITAL_COMMUNITY): Payer: Self-pay | Admitting: *Deleted

## 2019-11-13 VITALS — BP 122/66 | HR 82 | Temp 97.0°F | Ht 63.0 in | Wt 211.4 lb

## 2019-11-13 DIAGNOSIS — R05 Cough: Secondary | ICD-10-CM | POA: Diagnosis not present

## 2019-11-13 DIAGNOSIS — J4540 Moderate persistent asthma, uncomplicated: Secondary | ICD-10-CM | POA: Diagnosis not present

## 2019-11-13 DIAGNOSIS — J984 Other disorders of lung: Secondary | ICD-10-CM

## 2019-11-13 DIAGNOSIS — R053 Chronic cough: Secondary | ICD-10-CM

## 2019-11-13 DIAGNOSIS — G4733 Obstructive sleep apnea (adult) (pediatric): Secondary | ICD-10-CM

## 2019-11-13 MED ORDER — BREO ELLIPTA 200-25 MCG/INH IN AEPB: 1.0000 | INHALATION_SPRAY | Freq: Every day | RESPIRATORY_TRACT | 3 refills | Status: DC

## 2019-11-13 NOTE — Progress Notes (Signed)
Subjective:   PATIENT ID: Shirley Hubbard GENDER: female DOB: [birth year], MRN: [id number]   HPI  Chief Complaint  Patient presents with  . Follow-up    shortness of breath with exertion, fatigued and weak    Reason for Visit: Follow-up  [name] is a 59 year old female with history of Covid infection in 05/2019, mixed obstructive and restrictive lung defect and OSA who presents for follow-up.  Pulmonary progress note from 10/13/2019 by NP Volanda Napoleon was reviewed-PFTs were reviewed at this time and demonstrated restrictive defect as well as abnormal flow-volume loops concerning for possible VCD.  At that time patient was still not started on CPAP for her newly diagnosed OSA.  She was referred to ENT.  Also started on omeprazole and Flonase for chronic cough.  Since her last visit, she has been compliant with Breo. She is still awaiting her CPAP. She continues to hae shortness of breath and fatigue. Rarely wheezes. She is walking three times a week 1/4-1/2 mile at a slow pace.  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  Past Medical History:  Diagnosis Date  . Hyperlipidemia   . Hypertension    on medication but cant remember the name  . Lipoma   . Seroma    axillary    No Known Allergies   Outpatient Medications Prior to Visit  Medication Sig Dispense Refill  . acetaminophen (TYLENOL) 500 MG tablet Take by mouth.    Marland Kitchen albuterol (VENTOLIN HFA) 108 (90 Base) MCG/ACT inhaler Inhale 2 puffs into the lungs every 6 (six) hours as needed for wheezing or shortness of breath. 18 g 3  . aspirin EC 81 MG tablet Take by mouth.    Marland Kitchen atorvastatin (LIPITOR) 40 MG tablet TAKE ONE TABLET BY MOUTH EVERY NIGHT AT BEDTIME    . candesartan (ATACAND) 32 MG tablet TAKE ONE TABLET BY MOUTH DAILY     . ferrous sulfate 325 (65 FE) MG tablet Take by mouth.    . fluticasone (FLONASE) 50 MCG/ACT nasal spray Place 1 spray into both nostrils daily. 16 g 0  . fluticasone furoate-vilanterol (BREO ELLIPTA) 200-25 MCG/INH AEPB Inhale 1 puff into the lungs daily. 1 each 3  . fluticasone furoate-vilanterol (BREO ELLIPTA) 200-25 MCG/INH AEPB Inhale 1 puff into the lungs daily. 28 each 0  . HYDROcodone-homatropine (HYCODAN) 5-1.5 MG/5ML syrup Take 5 mLs by mouth every 6 (six) hours as needed for cough. 240 mL 0  . Multiple Vitamin (MULTI-VITAMIN) tablet Take by mouth.    Marland Kitchen omeprazole (PRILOSEC) 20 MG capsule Take 1 capsule (20 mg total) by mouth daily. 30 capsule 0   No facility-administered medications prior to visit.   Review of Systems  Constitutional: Negative for chills, diaphoresis, fever, malaise/fatigue and weight loss.  HENT: Negative for congestion.   Respiratory: Positive for cough, shortness of breath and wheezing. Negative for hemoptysis and sputum production.   Cardiovascular: Negative for chest pain, palpitations and leg swelling.    Objective:   Vitals:   11/13/19 0907  BP: 122/66  Pulse: 82  Temp: (!) 97 F (36.1 C)  TempSrc: Temporal  SpO2: 98%  Weight: 211 lb 6.4 oz (95.9 kg)  Height: 5\' 3"  (1.6 m)     Physical Exam: General: Well-appearing, no acute distress HENT: [place], AT Eyes: EOMI, no scleral icterus Respiratory: Clear  to auscultation bilaterally.  No crackles, wheezing or rales Cardiovascular: RRR, -M/R/G, no JVD GI: BS+, soft, nontender Extremities:-Edema,-tenderness Neuro: AAO x4, CNII-XII grossly intact Skin: Intact, no rashes or bruising Psych: Normal mood, normal affect  Data Reviewed:  Imaging: CXR  07/08/19 - no infiltrate, edema or effusion  PFT: 10/13/2019 FVC 1.85 (72%) FEV1 1.67 (83%) Ratio 88 TLC 72% DLCO 72% Interpretation: Mild restrictive defect present with mildly reduced gas exchange (uncorrected DLCO).  Significant bronchodilator  effect in FVC and FEV1.  Flow volume loops with flattened inspiratory limb  Sleep study: 08/29/19- Mild OSA with an AHI of 13.3 and SpO2 low of 82%.     Assessment & Plan:   Discussion: 59 year old female with history of Covid infection in 05/2019, mixed obstructive and restrictive lung defect and OSA who presents for follow-up.  Mild/moderate persistent asthma Restrictive lung disease - Moderate restriction on PFTs with no obstruction. +BD response. Diffusion capacity corrects for lung volumes - Continue Breo 1 puff daily - Continue Albuterol 2 puffs as need for shortness of breathing or wheezing - Referral to Pulmonary Rehab - Will provide work restriction note to return to work on 2/9. 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 Previously referred to ENT however this has not been scheduled  Mild OSA - HST in 08/2019 showed Mild OSA, AHI 13.3 - Start auto CPAP 5-15cm H20 with mask of choice and supplies   Health Maintenance Immunization History  Administered Date(s) Administered  . Tdap 03/23/2008, 07/10/2018    Orders Placed This Encounter  Procedures  . AMB referral to pulmonary rehabilitation    Referral Priority:   Routine    Referral Type:   Consultation    Number of Visits Requested:   1   Meds ordered this encounter  Medications  . fluticasone furoate-vilanterol (BREO ELLIPTA) 200-25 MCG/INH AEPB    Sig: Inhale 1 puff into the lungs daily.    Dispense:  1 each    Refill:  3    Return in about 3 months (around 02/11/2020).  I have spent a total time of 50-minutes on the day of the appointment reviewing prior  documentation, coordinating care and discussing medical diagnosis and plan with the patient/family. Imaging, labs and tests included in this note have been reviewed and interpreted independently by me.  [place], MD [facility] 11/13/2019 8:40 AM  Office Number [phone]

## 2019-11-13 NOTE — Progress Notes (Signed)
Received referral from Dr. Loanne Drilling for this pt to participate in pulmonary rehab with the the diagnosis of Restrictive Lung disease.  Pt lives in [facility] and this referral was originally sent to [facility] pulmonary rehab program.  Unfortunately they are not providing this service at this time and pt was referred to [facility]. Clinical review of pt follow up appt on 11/13/19 Pulmonary office note.  Pt with Covid Risk Score - 4.  Prior covid+ in August 2020. Pt appropriate for scheduling for  Virtual Pulmonary rehab  Due to the senior leadership directed instructions to pause onsite puulmonary rehab in response to the surge of Covid + patients within the health system staff were deployed to support health system and community needs. Will forward to support staff for scheduling and verification of insurance eligibility/benefits with pt consent. Cherre Huger, BSN Cardiac and Training and development officer

## 2019-11-13 NOTE — Patient Instructions (Addendum)
  Mild/moderate persistent asthma Restrictive lung disease - Moderate restriction on PFTs with no obstruction. +BD response. Diffusion capacity corrects for lung volumes - Continue Breo 1 puff daily - Continue Albuterol 2 puffs as need for shortness of breathing or wheezing - Referral to Pulmonary Rehab - Will provide work restriction note to return to work on 2/9. 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 Previously referred to ENT however this has not been scheduled  Mild OSA - HST in 08/2019 showed Mild OSA, AHI 13.3 - Start auto CPAP 5-15cm H20 with mask of choice and supplies     If you are interested in receiving your Covid-19 vaccine. You can visit [email or web address] and sign up through [facility].  Or if you want to schedule through [facility] Department you can visit Online at [email or web address] or By phone at [phone] (Option 2) from 8:00 am-5:00 pm, until all appointment slots have been filled.

## 2019-11-14 ENCOUNTER — Telehealth: Payer: Self-pay

## 2019-11-14 MED ORDER — MOMETASONE FURO-FORMOTEROL FUM 200-5 MCG/ACT IN AERO: 2.0000 | INHALATION_SPRAY | Freq: Two times a day (BID) | RESPIRATORY_TRACT | 3 refills | Status: DC

## 2019-11-14 NOTE — Telephone Encounter (Signed)
Paperwork changed and signed. Given to patient. - JE

## 2019-11-14 NOTE — Telephone Encounter (Signed)
Spoke with pt, she is ok with switching Dulera instead of Breo due to insurance coverage. She requested a refill of the Hycodan syrup but I advised her that I would have to ask Dr. Loanne Drilling first. I sent in [place] for the [facility] to her pharmacy at [facility]. She also states the dates on the notes are wrong on her paperwork and letter. I will leave the revised letter and paperwork in [place] folder to review. SHe states the letter should be dated 12/02/2019 and mark yes to RTW on the form. Will route to JE.

## 2019-11-14 NOTE — Telephone Encounter (Signed)
Ok to change Breo to [place]. Please notify patient regarding inhaler change and offer inhaler teaching.  Plan DC Breo START Dulera 200-5 mcg TWO puffs TWICE a day  Rodman Pickle, M.D. [facility] 11/14/2019 2:01 PM

## 2019-11-14 NOTE — Telephone Encounter (Signed)
PA request received for Breo 200. Med will not be covered unless pt has tried and failed 2 formulary alternatives.   Covered alternatives:  Symbicort 160, Dulera 200, Anoro, Trelegy, Incruse, Flovent HFA, Advair HFA. Per chart, patient has tried and failed Flovent, so she will need to try one more formulary inhaler before Breo coverage would be considered.  Dr. Loanne Drilling please advise on preferred formulary alternative.  Thanks!

## 2019-11-20 ENCOUNTER — Telehealth: Payer: Self-pay | Admitting: Family Medicine

## 2019-11-20 ENCOUNTER — Telehealth (HOSPITAL_COMMUNITY): Payer: Self-pay | Admitting: *Deleted

## 2019-11-20 NOTE — Telephone Encounter (Signed)
Called patient to discuss referral to pulmonary rehab, LMTCB to discuss and schedule for orientation/walk test.

## 2019-11-21 MED ORDER — HYDROCODONE-HOMATROPINE 5-1.5 MG/5ML PO SYRP: 5.0000 mL | ORAL_SOLUTION | Freq: Four times a day (QID) | ORAL | 0 refills | Status: DC | PRN

## 2019-11-21 NOTE — Telephone Encounter (Signed)
Refilled cough syrup 

## 2019-11-21 NOTE — Telephone Encounter (Signed)
Dr. Loanne Drilling, There was a prescription sent in for [facility] due to the insurance not covering Breo. The patient was requesting a refill on the hycodan cough syrup. Would you like to send in a refill for this?

## 2019-11-24 ENCOUNTER — Telehealth (HOSPITAL_COMMUNITY): Payer: Self-pay | Admitting: *Deleted

## 2019-11-24 NOTE — Telephone Encounter (Signed)
LMTCB re: referral to pulmonary rehab, this is 2nd phone call.

## 2019-11-27 ENCOUNTER — Telehealth (HOSPITAL_COMMUNITY): Payer: Self-pay | Admitting: *Deleted

## 2019-11-27 NOTE — Telephone Encounter (Signed)
This is the 3rd phone call I have made to patient re: pulmonary rehab referral, LMTCB.

## 2019-12-08 ENCOUNTER — Telehealth: Payer: Self-pay | Admitting: Family Medicine

## 2019-12-08 MED ORDER — ATORVASTATIN CALCIUM 40 MG PO TABS: 40.0000 mg | ORAL_TABLET | Freq: Every day | ORAL | 1 refills | Status: DC

## 2019-12-08 NOTE — Telephone Encounter (Signed)
Please advise 

## 2019-12-08 NOTE — Telephone Encounter (Signed)
I called and advised the patient. 

## 2019-12-08 NOTE — Telephone Encounter (Signed)
Patient called.   She was prescribed atorvastatin by her last doctor but no longer sees him. She is requesting [facility] continues that prescription for her.   Call back number: [phone]

## 2019-12-08 NOTE — Telephone Encounter (Signed)
Rx sent 

## 2019-12-15 ENCOUNTER — Telehealth: Payer: Self-pay | Admitting: Pulmonary Disease

## 2019-12-15 DIAGNOSIS — G4733 Obstructive sleep apnea (adult) (pediatric): Secondary | ICD-10-CM

## 2019-12-15 NOTE — Telephone Encounter (Signed)
Patient used 3/30 days. CPAP 95% pressure is 5.8cm h20. Recommend changing 5-10cm h20.

## 2019-12-15 NOTE — Telephone Encounter (Signed)
Order sent for pressure change to be sent to [facility] with the pt and notified that this was done

## 2019-12-15 NOTE — Telephone Encounter (Signed)
Spoke with the pt  She states that her CPAP pressure is way too much for her at auto 5-15 She has only been able to use for 2-3 nights for approx 5 min  She states that it's so much air that it takes her breath away  She is asking for settings to be lowered  I checked in [place] but no information since she has not been able to be compliant  Beth, Dr Loanne Drilling not in the office, please advise, thanks

## 2019-12-15 NOTE — Telephone Encounter (Signed)
Yes we can lower it. Do we have a download?

## 2019-12-15 NOTE — Telephone Encounter (Signed)
Pt calling back again.

## 2019-12-15 NOTE — Telephone Encounter (Signed)
DL printed and given to [facility]

## 2019-12-15 NOTE — Telephone Encounter (Signed)
ATC Patient.  Left message to call back. 

## 2020-01-14 ENCOUNTER — Telehealth: Payer: Self-pay | Admitting: Pulmonary Disease

## 2020-01-14 NOTE — Telephone Encounter (Signed)
I checked up front and the [facility] mail cabinet where [place]'s papers are kept and there are no forms  LMTCB

## 2020-01-15 NOTE — Telephone Encounter (Signed)
I called and spoke with the patient and her husband and asked for a number to call for the insurance company and also offered them an alternative fax number. Patient provided me with number [phone] and said Cyril Mourning is her short term disability representative.  Called [facility] and spoke with Jeani Hawking in the disability dept and she gave me an incorrect fax number and I provided her with [phone] and [phone] to fax the forms to.   Will wait to receive forms from [place].

## 2020-01-16 NOTE — Telephone Encounter (Signed)
Pt called regarding this-- she asked that when the forms do come in do not send them out--she will get them at appt on the the 30th

## 2020-01-16 NOTE — Telephone Encounter (Signed)
Forms have still not been received  Called [employer] back and was placed on hold x 10 min without being able to speak with anyone  Will need to call back and ask for forms to be refaxed Once they are received will need to be mailed to [employer]

## 2020-01-16 NOTE — Telephone Encounter (Signed)
Spoke with pt, advised that we would hold papers once we receive them.  I did advise that we have made multiple attempts to contact [employer] to get forms and have still not received them.  Pt aware, states she will also try to contact them to get these forms.

## 2020-01-17 ENCOUNTER — Encounter: Payer: Self-pay | Admitting: Pulmonary Disease

## 2020-01-19 NOTE — Telephone Encounter (Addendum)
lmtcb for pt to see if she was able to speak with a rep at [facility]. Pt has appt with JE on 01/20/2020.

## 2020-01-20 ENCOUNTER — Encounter: Payer: Self-pay | Admitting: Pulmonary Disease

## 2020-01-20 ENCOUNTER — Ambulatory Visit (INDEPENDENT_AMBULATORY_CARE_PROVIDER_SITE_OTHER): Payer: 59 | Admitting: Pulmonary Disease

## 2020-01-20 ENCOUNTER — Other Ambulatory Visit: Payer: Self-pay

## 2020-01-20 VITALS — BP 120/70 | HR 71 | Temp 97.5°F | Wt 210.8 lb

## 2020-01-20 DIAGNOSIS — R4189 Other symptoms and signs involving cognitive functions and awareness: Secondary | ICD-10-CM

## 2020-01-20 DIAGNOSIS — J4540 Moderate persistent asthma, uncomplicated: Secondary | ICD-10-CM | POA: Diagnosis not present

## 2020-01-20 DIAGNOSIS — R05 Cough: Secondary | ICD-10-CM

## 2020-01-20 DIAGNOSIS — Z9989 Dependence on other enabling machines and devices: Secondary | ICD-10-CM

## 2020-01-20 DIAGNOSIS — G4733 Obstructive sleep apnea (adult) (pediatric): Secondary | ICD-10-CM

## 2020-01-20 DIAGNOSIS — R053 Chronic cough: Secondary | ICD-10-CM

## 2020-01-20 DIAGNOSIS — Z9109 Other allergy status, other than to drugs and biological substances: Secondary | ICD-10-CM

## 2020-01-20 NOTE — Telephone Encounter (Signed)
I have made the patient aware of this issue while she was here in the office and she states that she would like a copy of the paperwork. I advised her that we will defiantly call her back once we get everything done.

## 2020-01-20 NOTE — Progress Notes (Signed)
Subjective:   PATIENT ID: Shirley Hubbard GENDER: female DOB: 10/08/1961, MRN: [id number]   HPI  Chief Complaint  Patient presents with  . Follow-up    Patient reports that she still has sob and some days are better than others.     Reason for Visit: Follow-up  Ms. Demeta Lightbody is a 59 year old female with history of Covid infection in 05/2019, mixed obstructive and restrictive lung defect and OSA who presents for follow-up.  She is compliant with her Ruthe Mannan and feels like it is helping. She is only using it two puffs daily. Still has shortness of breath and wheezing with exertion. Albuterol does help some. She continues to have chronic cough with white sputum.  She still has general weakness with occasional paraesthesia in her right hand that began in the last month which is a new complaint to me. As the weather has improved, she and her husband walk most days of the week 1/4 mile. She reports memory issues and difficulty concentrating.  She is compliant with CPAP nightly, wearing it 3-4 hours nightly. She reports improved breathing and quality of sleep after using CPAP.  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  Past Medical History:  Diagnosis Date  . Hyperlipidemia   . Hypertension    on medication but cant remember the name  . Lipoma   . Seroma    axillary    No Known Allergies   Outpatient Medications Prior to Visit  Medication Sig Dispense Refill  . acetaminophen (TYLENOL) 500 MG tablet Take by mouth.    Marland Kitchen albuterol (VENTOLIN HFA) 108 (90 Base) MCG/ACT inhaler Inhale 2 puffs into the lungs every 6 (six) hours as needed for wheezing or shortness of breath. 18 g 3  . aspirin EC 81 MG tablet Take by mouth.    Marland Kitchen atorvastatin (LIPITOR) 40 MG tablet Take 1 tablet (40 mg  total) by mouth daily. 90 tablet 1  . candesartan (ATACAND) 32 MG tablet TAKE ONE TABLET BY MOUTH DAILY    . ferrous sulfate 325 (65 FE) MG tablet Take by mouth.    . fluticasone (FLONASE) 50 MCG/ACT nasal spray Place 1 spray into both nostrils daily. 16 g 0  . HYDROcodone-homatropine (HYCODAN) 5-1.5 MG/5ML syrup Take 5 mLs by mouth every 6 (six) hours as needed for cough. 240 mL 0  . mometasone-formoterol (DULERA) 200-5 MCG/ACT AERO Inhale 2 puffs into the lungs 2 (two) times daily. 13 g 3  . Multiple Vitamin (MULTI-VITAMIN) tablet Take by mouth.    Marland Kitchen omeprazole (PRILOSEC) 20 MG capsule Take 1 capsule (20 mg total) by mouth daily. 30 capsule 0   No facility-administered medications prior to visit.   Review of Systems  Constitutional: Negative for chills, diaphoresis, fever, malaise/fatigue and weight loss.  HENT: Negative for congestion.   Respiratory: Positive for cough, shortness of breath and wheezing. Negative for hemoptysis and sputum production.   Cardiovascular: Negative for chest pain, palpitations and leg swelling.    Objective:   Vitals:   01/20/20 1547  BP: 120/70  Pulse: 71  Temp: (!) 97.5 F (36.4 C)  TempSrc: Temporal  SpO2: 96%  Weight: 210 lb 12.8 oz (95.6 kg)     Physical Exam: General: Well-appearing, no acute distress HENT: [place], AT Eyes: EOMI, no scleral icterus Respiratory: Clear to auscultation bilaterally.  No crackles, wheezing or rales Cardiovascular: RRR, -M/R/G, no JVD Extremities:-Edema,-tenderness Neuro: AAO x4, CNII-XII grossly intact Psych: Normal mood, normal affect  Data Reviewed:  Imaging: CXR  07/08/19 - no infiltrate, edema or effusion  PFT: 10/13/2019 FVC 1.85 (72%) FEV1 1.67 (83%) Ratio 88 TLC 72% DLCO 72% Interpretation: Mild restrictive defect present with mildly reduced gas exchange (uncorrected DLCO).  Significant bronchodilator effect in FVC and FEV1.  Flow volume loops with flattened inspiratory limb  Sleep study: 08/29/19-  Mild OSA with an AHI of 13.3 and SpO2 low of 82%.     Assessment & Plan:   Discussion: 59 year old female with hx COVID infection 05/2019, mixed obstructive and restrictive lung defect and OSA who presents for follow-up.  Impaired cognition Began when she was diagnosed with COVID and has been persistent with episodes of forgetfulness and recall - REFER to NEUROLOGY  Mild/moderate persistent asthma Restrictive lung disease - Moderate restriction on PFTs with no obstruction. +BD response. 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  - RE-REFER to ENT - Continue bronchodilators as above - Consider restarting your reflux - Flonase as needed  Mild OSA - HST in 08/2019 showed Mild OSA, AHI 13.3 - CONTINUE CPAP   Health Maintenance Immunization History  Administered Date(s) Administered  . Tdap 03/23/2008, 07/10/2018   Orders Placed This Encounter  Procedures  . Ambulatory referral to ENT    Referral Priority:   Routine    Referral Type:   Consultation    Referral Reason:   Specialty Services Required    Requested Specialty:   Otolaryngology    Number of Visits Requested:   1  . Ambulatory referral to Neurology    Referral Priority:   Routine    Referral Type:   Consultation    Referral Reason:   Specialty Services Required    Requested Specialty:   Neurology    Number of Visits Requested:   1   No orders of the defined types were placed in this encounter.  Return in about 3 months (around 04/21/2020).  [place], MD [facility] 01/20/2020 3:24 PM  Office Number [phone]

## 2020-01-20 NOTE — Telephone Encounter (Signed)
I have called [employer] at the number provided Pt id number [id number]  Spoke with rep  They were sending the fax to the wrong number- [phone] I advised to send to [phone]  He put in a request and it will be faxed to this number  Once received will need to go to [employer]  Will hold in triage until received

## 2020-01-20 NOTE — Patient Instructions (Addendum)
Impaired cognition Began when she was diagnosed with COVID and has been persistent with episodes of forgetfulness and recall - REFER to NEUROLOGY  Mild/moderate persistent asthma Restrictive lung disease - Moderate restriction on PFTs with no obstruction. +BD response. 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  - RE-REFER to ENT - Continue bronchodilators as above - Consider restarting your reflux - Flonase as needed  Mild OSA - HST in 08/2019 showed Mild OSA, AHI 13.3 - CONTINUE CPAP wheezing

## 2020-01-21 NOTE — Telephone Encounter (Signed)
I checked both fax machines and Dr [employer] and we still have not received a fax

## 2020-01-26 NOTE — Telephone Encounter (Signed)
Still have not received this fax after multiple times asking for it to be faxed.  Pt is aware.

## 2020-01-30 ENCOUNTER — Telehealth: Payer: Self-pay | Admitting: Pulmonary Disease

## 2020-01-30 NOTE — Telephone Encounter (Signed)
Lauren would you receive this request or should we route to [facility]?

## 2020-02-05 NOTE — Telephone Encounter (Signed)
Left a message on a generic VM with [employer] advising them they would need to contact our medical records department for the records they need. Our medical records information was left along with this message. Nothing further needed.

## 2020-02-16 ENCOUNTER — Other Ambulatory Visit: Payer: Self-pay

## 2020-02-16 ENCOUNTER — Ambulatory Visit (INDEPENDENT_AMBULATORY_CARE_PROVIDER_SITE_OTHER): Payer: 59 | Admitting: Pulmonary Disease

## 2020-02-16 ENCOUNTER — Encounter: Payer: Self-pay | Admitting: Pulmonary Disease

## 2020-02-16 VITALS — BP 112/64 | HR 76 | Temp 97.3°F | Ht 63.0 in | Wt 212.0 lb

## 2020-02-16 DIAGNOSIS — G4733 Obstructive sleep apnea (adult) (pediatric): Secondary | ICD-10-CM

## 2020-02-16 DIAGNOSIS — J4540 Moderate persistent asthma, uncomplicated: Secondary | ICD-10-CM

## 2020-02-16 DIAGNOSIS — R4189 Other symptoms and signs involving cognitive functions and awareness: Secondary | ICD-10-CM | POA: Diagnosis not present

## 2020-02-16 MED ORDER — MOMETASONE FURO-FORMOTEROL FUM 200-5 MCG/ACT IN AERO: 2.0000 | INHALATION_SPRAY | Freq: Two times a day (BID) | RESPIRATORY_TRACT | 12 refills | Status: DC

## 2020-02-16 MED ORDER — HYDROCODONE-HOMATROPINE 5-1.5 MG/5ML PO SYRP: 5.0000 mL | ORAL_SOLUTION | Freq: Four times a day (QID) | ORAL | 0 refills | Status: DC | PRN

## 2020-02-16 NOTE — Progress Notes (Signed)
Subjective:   PATIENT ID: Shirley Hubbard GENDER: female DOB: 1960-12-06, MRN: [id number]   HPI  Chief Complaint  Patient presents with  . Follow-up    small amout of wheezing, SOB with activity, productive cough with clear muscus    Reason for Visit: Follow-up  Shirley Hubbard is a 59 year old female with history of Covid infection in 05/2019, mixed obstructive and restrictive lung defect and OSA who presents for follow-up.  Overall her dyspnea and strength has improved and is wanting to return to work. She is able to go on daily walks with her husband for 1/4-1/2 mile without issues. She has wheezing and shortness of breath has increased with the pollen. Still has chronic cough that improves with cough syrup and rest. She is compliant with her Dulera however only using ONE puff ONCE a day. She thought she was supposed to still be using Breo. Has started using albuterol everyday. She is using CPAP nightly and using it up to 6 hours nightly. She reports improved breathing and quality of sleep with CPAP.  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  Past Medical History:  Diagnosis Date  . Hyperlipidemia   . Hypertension    on medication but cant remember the name  . Lipoma   . Seroma    axillary    No Known Allergies   Outpatient Medications Prior to Visit  Medication Sig Dispense Refill  . acetaminophen (TYLENOL) 500 MG tablet Take by mouth.    Marland Kitchen albuterol (VENTOLIN HFA) 108 (90 Base) MCG/ACT inhaler Inhale 2 puffs into the lungs every 6 (six) hours as needed for wheezing or shortness of breath. 18 g 3  . aspirin EC 81 MG tablet Take by mouth.    Marland Kitchen atorvastatin (LIPITOR) 40 MG tablet Take 1 tablet (40 mg total) by mouth daily. 90 tablet 1  . candesartan (ATACAND) 32 MG tablet TAKE  ONE TABLET BY MOUTH DAILY    . ferrous sulfate 325 (65 FE) MG tablet Take by mouth.    . fluticasone (FLONASE) 50 MCG/ACT nasal spray Place 1 spray into both nostrils daily. 16 g 0  . Multiple Vitamin (MULTI-VITAMIN) tablet Take by mouth.    Marland Kitchen omeprazole (PRILOSEC) 20 MG capsule Take 1 capsule (20 mg total) by mouth daily. 30 capsule 0  . HYDROcodone-homatropine (HYCODAN) 5-1.5 MG/5ML syrup Take 5 mLs by mouth every 6 (six) hours as needed for cough. 240 mL 0  . mometasone-formoterol (DULERA) 200-5 MCG/ACT AERO Inhale 2 puffs into the lungs 2 (two) times daily. 13 g 3   No facility-administered medications prior to visit.   Review of Systems  Constitutional: Negative for chills, diaphoresis, fever, malaise/fatigue and weight loss.  HENT: Negative for congestion.   Respiratory: Positive for cough, shortness of breath and wheezing. Negative for hemoptysis and sputum production.   Cardiovascular: Negative for chest pain, palpitations and leg swelling.    Objective:   Vitals:   02/16/20 0931  BP: 112/64  Pulse: 76  Temp: (!) 97.3 F (36.3 C)  TempSrc: Temporal  SpO2: 96%  Weight: 212 lb (96.2 kg)  Height: 5\' 3"  (1.6 m)   SpO2: 96 % O2 Device: None (Room air)   Physical Exam: General: Well-appearing, no acute distress HENT: [place], AT Eyes: EOMI, no scleral icterus Respiratory: Clear to auscultation bilaterally.  No crackles, wheezing  or rales Cardiovascular: RRR, -M/R/G, no JVD Extremities:-Edema,-tenderness Neuro: AAO x4, CNII-XII grossly intact Skin: Intact, no rashes or bruising Psych: Normal mood, normal affect  Data Reviewed:  Imaging: CXR  07/08/19 - no infiltrate, edema or effusion CTA 08/07/19 - no pulmonary emboli, mosaic attenuation suggestive of airtrapping  PFT: 10/13/2019 FVC 1.85 (72%) FEV1 1.67 (83%) Ratio 88 TLC 72% DLCO 72% Interpretation: Mild restrictive defect present with mildly reduced gas exchange (uncorrected DLCO).  Significant bronchodilator  effect in FVC and FEV1.  Flow volume loops with flattened inspiratory limb  Sleep study: 08/29/19- Mild OSA with an AHI of 13.3 and SpO2 low of 82%.   CPAP Compliance 01/17/20-02/15/20 Usage days 100% >4h 100%    Assessment & Plan:   Discussion: 58 year old female with prior COVID infection 05/2019, mixed obstructive and restrictive lung defect and OSA who presents for follow-up. Overall improved however worsening dyspnea and wheezing due to pollen triggering her asthma  Moderate persistent asthma Restrictive lung disease - Moderate restriction on PFTs with no obstruction. +BD response. Diffusion capacity corrects for lung volumes - CONTINUE Dulera TWO puffs TWICE a day - STOP Breo - USE Albuterol AS NEEDED for shortness of breath or wheezing  Mild OSA - compliant and tolerating well - HST in 08/2019 showed Mild OSA, AHI 13.3 - Reviewed CPAP compliance report. Excellent compliance and improvement in AHI and subjective symptoms per patient - CONTINUE CPAP   Impaired cognition Began when she was diagnosed with COVID and has been persistent with episodes of forgetfulness and recall - still has fog - Planning to see Neurologist once insurance is approved  Chronic cough, may be multifactorial in setting of mixed obstructive and restrictive lung defect, reflux +/-allergies - Flow volume loop showed evidence of possible VCD.  - Awaiting ENT callback - Continue bronchodilators as above - Consider restarting your reflux - Flonase as needed  Fill out paperwork to return to work without restrictions per patient request.  Health Maintenance Immunization History  Administered Date(s) Administered  . Moderna SARS-COVID-2 Vaccination 01/31/2020  . Tdap 03/23/2008, 07/10/2018   No orders of the defined types were placed in this encounter.  Meds ordered this encounter  Medications  . mometasone-formoterol (DULERA) 200-5 MCG/ACT AERO    Sig: Inhale 2 puffs into the lungs 2 (two) times  daily.    Dispense:  13 g    Refill:  12  . HYDROcodone-homatropine (HYCODAN) 5-1.5 MG/5ML syrup    Sig: Take 5 mLs by mouth every 6 (six) hours as needed for cough.    Dispense:  240 mL    Refill:  0   Return in about 5 months (around 07/18/2020).  [place], MD [facility] 02/16/2020 10:22 AM  Office Number [phone]

## 2020-02-16 NOTE — Patient Instructions (Signed)
Moderate Persistent Asthma - CONTINUE Dulera TWO puffs TWICE a day - STOP Breo - USE Albuterol AS NEEDED for shortness of breath or wheezing  Mild OSA - CONTINUE CPAP nightly  Follow-up in 5 months with me

## 2020-02-17 ENCOUNTER — Telehealth: Payer: Self-pay | Admitting: Pulmonary Disease

## 2020-02-19 NOTE — Telephone Encounter (Signed)
Rec'd completed paperwork - fwd to [employer] via interoffice mail -pr  

## 2020-03-10 ENCOUNTER — Telehealth: Payer: Self-pay | Admitting: Family Medicine

## 2020-03-10 MED ORDER — ATORVASTATIN CALCIUM 40 MG PO TABS: 40.0000 mg | ORAL_TABLET | Freq: Every day | ORAL | 1 refills | Status: DC

## 2020-03-10 MED ORDER — CANDESARTAN CILEXETIL 32 MG PO TABS
32.0000 mg | ORAL_TABLET | Freq: Every day | ORAL | 1 refills | Status: DC
Start: 2020-03-10 — End: 2020-09-22

## 2020-03-10 NOTE — Telephone Encounter (Signed)
Sent!

## 2020-03-10 NOTE — Addendum Note (Signed)
Addended by: Hortencia Pilar on: 03/10/2020 01:54 PM   Modules accepted: Orders

## 2020-03-10 NOTE — Telephone Encounter (Signed)
Patient called  She needs a refill on her blood pressure and cholesterol medication.  Call back: [phone]

## 2020-03-10 NOTE — Telephone Encounter (Signed)
Please advise 

## 2020-03-10 NOTE — Telephone Encounter (Signed)
I called and advised the patient both medications were sent in - 3 months' supply with 1 refill on each.

## 2020-04-08 ENCOUNTER — Telehealth: Payer: Self-pay | Admitting: Primary Care

## 2020-04-08 NOTE — Telephone Encounter (Signed)
Attempted to call pt but unable to reach. Left message for her to return call. 

## 2020-04-08 NOTE — Telephone Encounter (Signed)
I do not know why she can not work. Per Dr. Rosario Adie note her dyspnea and strength had overall improved and she was wanting to return to work. Last visit states that she is able to go on daily walks with her husband for 1/4-1/2 mile without issues.  We can get a note that says she needs to follow weight restriction of 10lbs for lifting and carrying, avoid frequent ambulation with preference for sedentary jobs if available

## 2020-04-08 NOTE — Telephone Encounter (Signed)
I have placed the letter that the patient had provided in Beth's box for her to review. Thanks -pr

## 2020-04-08 NOTE — Telephone Encounter (Signed)
Shirley Hubbard, please advise if you are okay with [place] writing a letter based off of response stated and if so, please advise what we should state in the letter.

## 2020-04-09 NOTE — Telephone Encounter (Signed)
Called patient x2 but the phone did not ring. Will attempt to call back later today.

## 2020-04-09 NOTE — Telephone Encounter (Signed)
Pt called back, please return call  

## 2020-04-09 NOTE — Telephone Encounter (Signed)
Spoke with pt. States that she is not needing a letter to be out of work now. She is needing a letter to cover the dates she was out of work when she had [place] which are 08/21/2019 - 12/02/2019. Her employer is stating that there was no reason as to why she could not work during these dates. The letter needs to include explicit information as to why she could not work during this time period.  Beth - please advise. Thanks.  *Pt is aware that it will be next week before this is handled.

## 2020-04-09 NOTE — Telephone Encounter (Signed)
Attempted to call pt but unable to reach. Left message for her to return call. 

## 2020-04-09 NOTE — Telephone Encounter (Signed)
Pt called back about this, please return call anytime today.

## 2020-04-09 NOTE — Telephone Encounter (Signed)
Attempted to call pt but there was no answer. I have left a message for pt to return our call x2.

## 2020-04-12 NOTE — Telephone Encounter (Signed)
Patient is returning phone call. Patient phone number is [phone].

## 2020-04-12 NOTE — Telephone Encounter (Signed)
Called and left message for patient to return call regarding letter to her employer. Waiting for call.

## 2020-04-12 NOTE — Telephone Encounter (Signed)
Ok, yes we can provide a note to employer stating that she needed to be out of work d.t covid-19. Please make sure it is ok with patient that I she is ok with me writing specific information as to why she could not work. If she does not want that disclosed please provide generic letter with dates she was out and that she was seen by our office multiple times d.t complications from covid. Cleared to go back to work in April by Dr. Loanne Drilling.   "Patient was referred to pulmonary office and seen for consult in October 2020 for persistent shortness of breath d/t covid-19. Before covid diagnosis in August 2020 she had no known underlying lung conditions and was a life-long non-smoker. She developed significant dyspnea on exertion, fatigue and cognitive impairment. She was seen multiple times in our office by Dr. Loanne Drilling d/t residual COVID-19 symptoms which impaired her ability to perform most activities of daily living. Pulmonary function testing showed moderate restrictive/obstructive lung disease with possible underlying vocal cord dysfunction. Maintained on ICS/LABA inhaler. She was also diagnosed with sleep apnea during this time and started on auto CPAP.  She was seen by Dr. Loanne Drilling on 02/16/20 and cleared to go back to work.

## 2020-04-12 NOTE — Telephone Encounter (Signed)
ATC pt, received a busy signal x2. °Will try back. °

## 2020-04-20 NOTE — Telephone Encounter (Signed)
Patient contacted, letter prepared and placed at the front desk for pick up. Patient read the statement from the provider and feels this will meet the needs of Shirley Hubbard, her short term disability company. See second version of letter.

## 2020-07-09 ENCOUNTER — Telehealth: Payer: Self-pay | Admitting: Pulmonary Disease

## 2020-07-09 NOTE — Telephone Encounter (Signed)
Spoke with the pt  She is requesting a refill on her hycodan cough syrup  She states her cough is unchanged since her last visit here  Last rx #240 ml 02/16/20  Please advise thanks

## 2020-07-14 NOTE — Telephone Encounter (Signed)
Dr. Loanne Drilling should be in clinic this afternoon 9/22 so she should be able to address this then. Will await response.

## 2020-07-17 MED ORDER — HYDROCODONE-HOMATROPINE 5-1.5 MG/5ML PO SYRP: 5.0000 mL | ORAL_SOLUTION | Freq: Four times a day (QID) | ORAL | 0 refills | Status: DC | PRN

## 2020-07-19 NOTE — Telephone Encounter (Signed)
Patient needs 30 minute appointment she is in a 15 minute please call patient and reschedule

## 2020-07-19 NOTE — Telephone Encounter (Signed)
Left a voicemail regarding prior message. Made a f/u for patient at Dr.Ellison's next available opening on 10/1/821 at 10:45 am.Will leave chart open until patient is aware of appt and medication sent into her pharmacy .

## 2020-07-20 NOTE — Telephone Encounter (Signed)
Left a voicemail on patient's phone to cal our office back to reschedule the appt I have made for patient.

## 2020-07-21 NOTE — Telephone Encounter (Signed)
Attempted to call pt but unable to reach. Left message for pt to return call as we need to change her appt to a different day due to it needing to be a 69min visit.  Routing to front desk pool for them to help [place] out getting this changed.

## 2020-07-22 NOTE — Telephone Encounter (Signed)
Spoke with patient regarding prior appt made. Was able to move patient to 08/18/20 at 9:30am to see Dr.Ellison. Patient's voice was understanding . Nothing else further needed.

## 2020-07-23 ENCOUNTER — Ambulatory Visit: Payer: 59 | Admitting: Pulmonary Disease

## 2020-08-18 ENCOUNTER — Other Ambulatory Visit: Payer: Self-pay | Admitting: Pulmonary Disease

## 2020-08-18 ENCOUNTER — Encounter: Payer: Self-pay | Admitting: Pulmonary Disease

## 2020-08-18 ENCOUNTER — Ambulatory Visit (INDEPENDENT_AMBULATORY_CARE_PROVIDER_SITE_OTHER): Payer: 59 | Admitting: Pulmonary Disease

## 2020-08-18 ENCOUNTER — Other Ambulatory Visit: Payer: Self-pay

## 2020-08-18 VITALS — BP 136/76 | HR 88 | Temp 97.8°F | Ht 63.0 in | Wt 199.0 lb

## 2020-08-18 DIAGNOSIS — R6889 Other general symptoms and signs: Secondary | ICD-10-CM

## 2020-08-18 DIAGNOSIS — G4733 Obstructive sleep apnea (adult) (pediatric): Secondary | ICD-10-CM

## 2020-08-18 DIAGNOSIS — J4540 Moderate persistent asthma, uncomplicated: Secondary | ICD-10-CM

## 2020-08-18 DIAGNOSIS — R0602 Shortness of breath: Secondary | ICD-10-CM

## 2020-08-18 MED ORDER — MOMETASONE FURO-FORMOTEROL FUM 200-5 MCG/ACT IN AERO: 1.0000 | INHALATION_SPRAY | Freq: Two times a day (BID) | RESPIRATORY_TRACT | 6 refills | Status: AC

## 2020-08-18 MED ORDER — ALBUTEROL SULFATE HFA 108 (90 BASE) MCG/ACT IN AERS: 2.0000 | INHALATION_SPRAY | Freq: Four times a day (QID) | RESPIRATORY_TRACT | 3 refills | Status: AC | PRN

## 2020-08-18 NOTE — Patient Instructions (Signed)
Moderate persistent asthma Restrictive lung disease - Moderate restriction on PFTs with no obstruction. +BD response. Diffusion capacity corrects for lung volumes - REFILLED Dulera ONE puff TWICE a day - USE Albuterol AS NEEDED for shortness of breath or wheezing  Mild OSA - compliant and tolerating well - HST in 08/2019 showed Mild OSA, AHI 13.3 - Counseled on sleep hygiene - Counseled on weight loss/maintenance of healthy weight - Counseled NOT to drive if/when sleepy - Advised patient to wear CPAP for at least 4 hours each night for greater than 70% of the time to avoid the machine being repossessed by insurance.  Impaired cognition Began when she was diagnosed with COVID and has been persistent with episodes of forgetfulness and recall - still has fog - Recommend discussing with PCP  Provide work note

## 2020-08-18 NOTE — Progress Notes (Addendum)
Subjective:   PATIENT ID: Shirley Hubbard GENDER: female DOB: Sep 15, 1961, MRN: [id number]   HPI  Chief Complaint  Patient presents with  . Follow-up    Reason for Visit: Follow-up  [name] is a 59 year old female with history of Covid infection in 05/2019, mixed obstructive and restrictive lung defect and OSA who presents for follow-up.  She is on [facility] and is taking it two puffs once a day. She continues to have shortness of breath with moderate to heavy exertion. She has a difficult time with activities that require upper body strength including at work that result in shortness of breath. She has a daily cough that seems to occur more often in the night. She denies wheezing. She is able to go on daily walks at a slow steady pace, travel 1.5-2 miles three times a week. At home she does not need her albuterol inhaler but she has needed to use her rescue inhaler 1-2 times since returning to work.  She is intermittently compliant with her CPAP but when she does use it she eels it help tremendously with her breathing and quality of sleep. She report she wears it at least five hours nightly. She continues to have forgetfulness that initially appeared when she had covid.  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  Past Medical History:  Diagnosis Date  . Hyperlipidemia   . Hypertension    on medication but cant remember the name  . Lipoma   . Seroma    axillary    No Known Allergies   Outpatient Medications Prior to Visit  Medication Sig Dispense Refill  . acetaminophen (TYLENOL) 500 MG tablet Take by mouth.    Marland Kitchen aspirin EC 81 MG tablet Take by mouth.    Marland Kitchen atorvastatin (LIPITOR) 40 MG tablet Take 1 tablet (40 mg total) by mouth daily. 90 tablet 1  . candesartan (ATACAND) 32  MG tablet Take 1 tablet (32 mg total) by mouth daily. 90 tablet 1  . ferrous sulfate 325 (65 FE) MG tablet Take by mouth.    . fluticasone (FLONASE) 50 MCG/ACT nasal spray Place 1 spray into both nostrils daily. 16 g 0  . HYDROcodone-homatropine (HYCODAN) 5-1.5 MG/5ML syrup Take 5 mLs by mouth every 6 (six) hours as needed for cough. 240 mL 0  . Multiple Vitamin (MULTI-VITAMIN) tablet Take by mouth.    Marland Kitchen omeprazole (PRILOSEC) 20 MG capsule Take 1 capsule (20 mg total) by mouth daily. 30 capsule 0  . albuterol (VENTOLIN HFA) 108 (90 Base) MCG/ACT inhaler Inhale 2 puffs into the lungs every 6 (six) hours as needed for wheezing or shortness of breath. 18 g 3  . mometasone-formoterol (DULERA) 200-5 MCG/ACT AERO Inhale 2 puffs into the lungs 2 (two) times daily. 13 g 12   No facility-administered medications prior to visit.   Review of Systems  Constitutional: Negative for chills, diaphoresis, fever, malaise/fatigue and weight loss.  HENT: Negative for congestion.   Respiratory: Positive for cough and shortness of breath. Negative for hemoptysis, sputum production and wheezing.   Cardiovascular: Negative for chest pain, palpitations and leg swelling.    Objective:   Vitals:   08/18/20 0941  BP: 136/76  Pulse: 88  Temp: 97.8 F (36.6 C)  SpO2: 97%  Weight: 199 lb (90.3 kg)  Height: 5\' 3"  (1.6 m)  SpO2: 97 % O2 Device: None (Room air)   Physical Exam: General: Well-appearing, no acute distress HENT: [place], AT Eyes: EOMI, no scleral icterus Respiratory: Clear to auscultation bilaterally.  No crackles, wheezing or rales Cardiovascular: RRR, -M/R/G, no JVD Extremities:-Edema,-tenderness Neuro: AAO x4, CNII-XII grossly intact Skin: Intact, no rashes or bruising Psych: Normal mood, normal affect  Data Reviewed:  Imaging: CXR  07/08/19 - no infiltrate, edema or effusion CTA 08/07/19 - no pulmonary emboli, mosaic attenuation suggestive of airtrapping  PFT: 10/13/2019 FVC 1.85 (72%)  FEV1 1.67 (83%) Ratio 88 TLC 72% DLCO 72% Interpretation: Mild restrictive defect present with mildly reduced gas exchange (uncorrected DLCO).  Significant bronchodilator effect in FVC and FEV1.  Flow volume loops with flattened inspiratory limb  Sleep study: 08/29/19- Mild OSA with an AHI of 13.3 and SpO2 low of 82%.   CPAP Compliance 07/09/20-08/07/20 Usage days 63% >4h 57%    Assessment & Plan:   Discussion: 59 year old female with prior COVID infection 05/2019, mixed obstructive and restrictive lung defect and OSA who presents for follow-up. We discussed benefits of Pulmonary rehab. We discussed compliance with CPAP  Moderate persistent asthma Restrictive lung disease - REFILLED Dulera ONE puff TWICE a day - USE Albuterol AS NEEDED for shortness of breath or wheezing - Refer to pulmonary rehab  Mild OSA - intermittently compliant - HST in 08/2019 showed Mild OSA, AHI 13.3 - Counseled NOT to drive if/when sleepy - Advised patient to wear CPAP for at least 4 hours each night for greater than 70% of the time to avoid the machine being repossessed by insurance.  Impaired cognition Began when she was diagnosed with COVID and has been persistent with episodes of forgetfulness and recall - still has fog - Advised to continue CPAP as directed - Recommend discussing with PCP  Provide work note as noted in [place] Maintenance Immunization History  Administered Date(s) Administered  . Moderna SARS-COVID-2 Vaccination 01/31/2020  . Tdap 03/23/2008, 07/10/2018   No orders of the defined types were placed in this encounter.  Meds ordered this encounter  Medications  . mometasone-formoterol (DULERA) 200-5 MCG/ACT AERO    Sig: Inhale 1 puff into the lungs 2 (two) times daily.    Dispense:  8.8 g    Refill:  6  . albuterol (VENTOLIN HFA) 108 (90 Base) MCG/ACT inhaler    Sig: Inhale 2 puffs into the lungs every 6 (six) hours as needed for wheezing or shortness of breath.     Dispense:  18 g    Refill:  3   Return in about 3 months (around 11/18/2020).  I have spent a total time of 36-minutes on the day of the appointment reviewing prior documentation, coordinating care and discussing medical diagnosis and plan with the patient/family. Imaging, labs and tests included in this note have been reviewed and interpreted independently by me.  [place], MD [facility] 08/18/2020 10:44 AM  Office Number [phone]

## 2020-08-26 ENCOUNTER — Encounter (HOSPITAL_COMMUNITY): Payer: Self-pay | Admitting: *Deleted

## 2020-08-26 NOTE — Progress Notes (Signed)
Received another referral from Dr. Loanne Drilling for this pt to participate in pulmonary rehab with the the diagnosis of shortness of breath and moderate persistent asthma without complication. Pt was referred in February of this year and did not respond to messages left therefore the referral was closed. Clinical review of pt follow up appt on 10/27Pulmonary office note.  Pt with Covid Risk Score - 4. Pt appropriate for scheduling for Pulmonary rehab.  Will forward to support staff for scheduling and verification of insurance eligibility/benefits with pt consent. Cherre Huger, BSN Cardiac and Training and development officer

## 2020-08-30 ENCOUNTER — Encounter (HOSPITAL_COMMUNITY): Payer: Self-pay

## 2020-08-30 ENCOUNTER — Telehealth (HOSPITAL_COMMUNITY): Payer: Self-pay

## 2020-08-30 NOTE — Telephone Encounter (Signed)
Called pt to see if she is interested in the pulmonary rehab program [facility] Mailed letter

## 2020-08-30 NOTE — Telephone Encounter (Signed)
Pt insurance is active and benefits verified through [employer] Co-pay 0, DED $550/0 met, out of pocket $2,500/0 met, co-insurance 20%. no pre-authorization required. Passport, Lyn[employer] 08/30/2020@3 :20pm, REF# [id number]

## 2020-09-10 ENCOUNTER — Emergency Department (HOSPITAL_COMMUNITY): Payer: 59

## 2020-09-10 ENCOUNTER — Other Ambulatory Visit: Payer: Self-pay

## 2020-09-10 ENCOUNTER — Emergency Department (HOSPITAL_COMMUNITY)
Admission: EM | Admit: 2020-09-10 | Discharge: 2020-09-10 | Disposition: A | Discharge status: Home / Self Care | Payer: 59 | Attending: Emergency Medicine | Admitting: Emergency Medicine

## 2020-09-10 DIAGNOSIS — Y9389 Activity, other specified: Secondary | ICD-10-CM | POA: Diagnosis not present

## 2020-09-10 DIAGNOSIS — Z7982 Long term (current) use of aspirin: Secondary | ICD-10-CM | POA: Diagnosis not present

## 2020-09-10 DIAGNOSIS — Y92410 Unspecified street and highway as the place of occurrence of the external cause: Secondary | ICD-10-CM | POA: Insufficient documentation

## 2020-09-10 DIAGNOSIS — R519 Headache, unspecified: Secondary | ICD-10-CM | POA: Diagnosis present

## 2020-09-10 DIAGNOSIS — Z79899 Other long term (current) drug therapy: Secondary | ICD-10-CM | POA: Insufficient documentation

## 2020-09-10 DIAGNOSIS — I10 Essential (primary) hypertension: Secondary | ICD-10-CM | POA: Diagnosis not present

## 2020-09-10 DIAGNOSIS — M545 Low back pain, unspecified: Secondary | ICD-10-CM | POA: Insufficient documentation

## 2020-09-10 IMAGING — CR DG CHEST 2V
2 series · 2 of 2 positions shown · non-contrast
Comparison: Chest radiograph dated [DATE].

CLINICAL DATA: 59-year-old female with chest pain.

EXAM:
CHEST - 2 VIEW

[w chest pa]
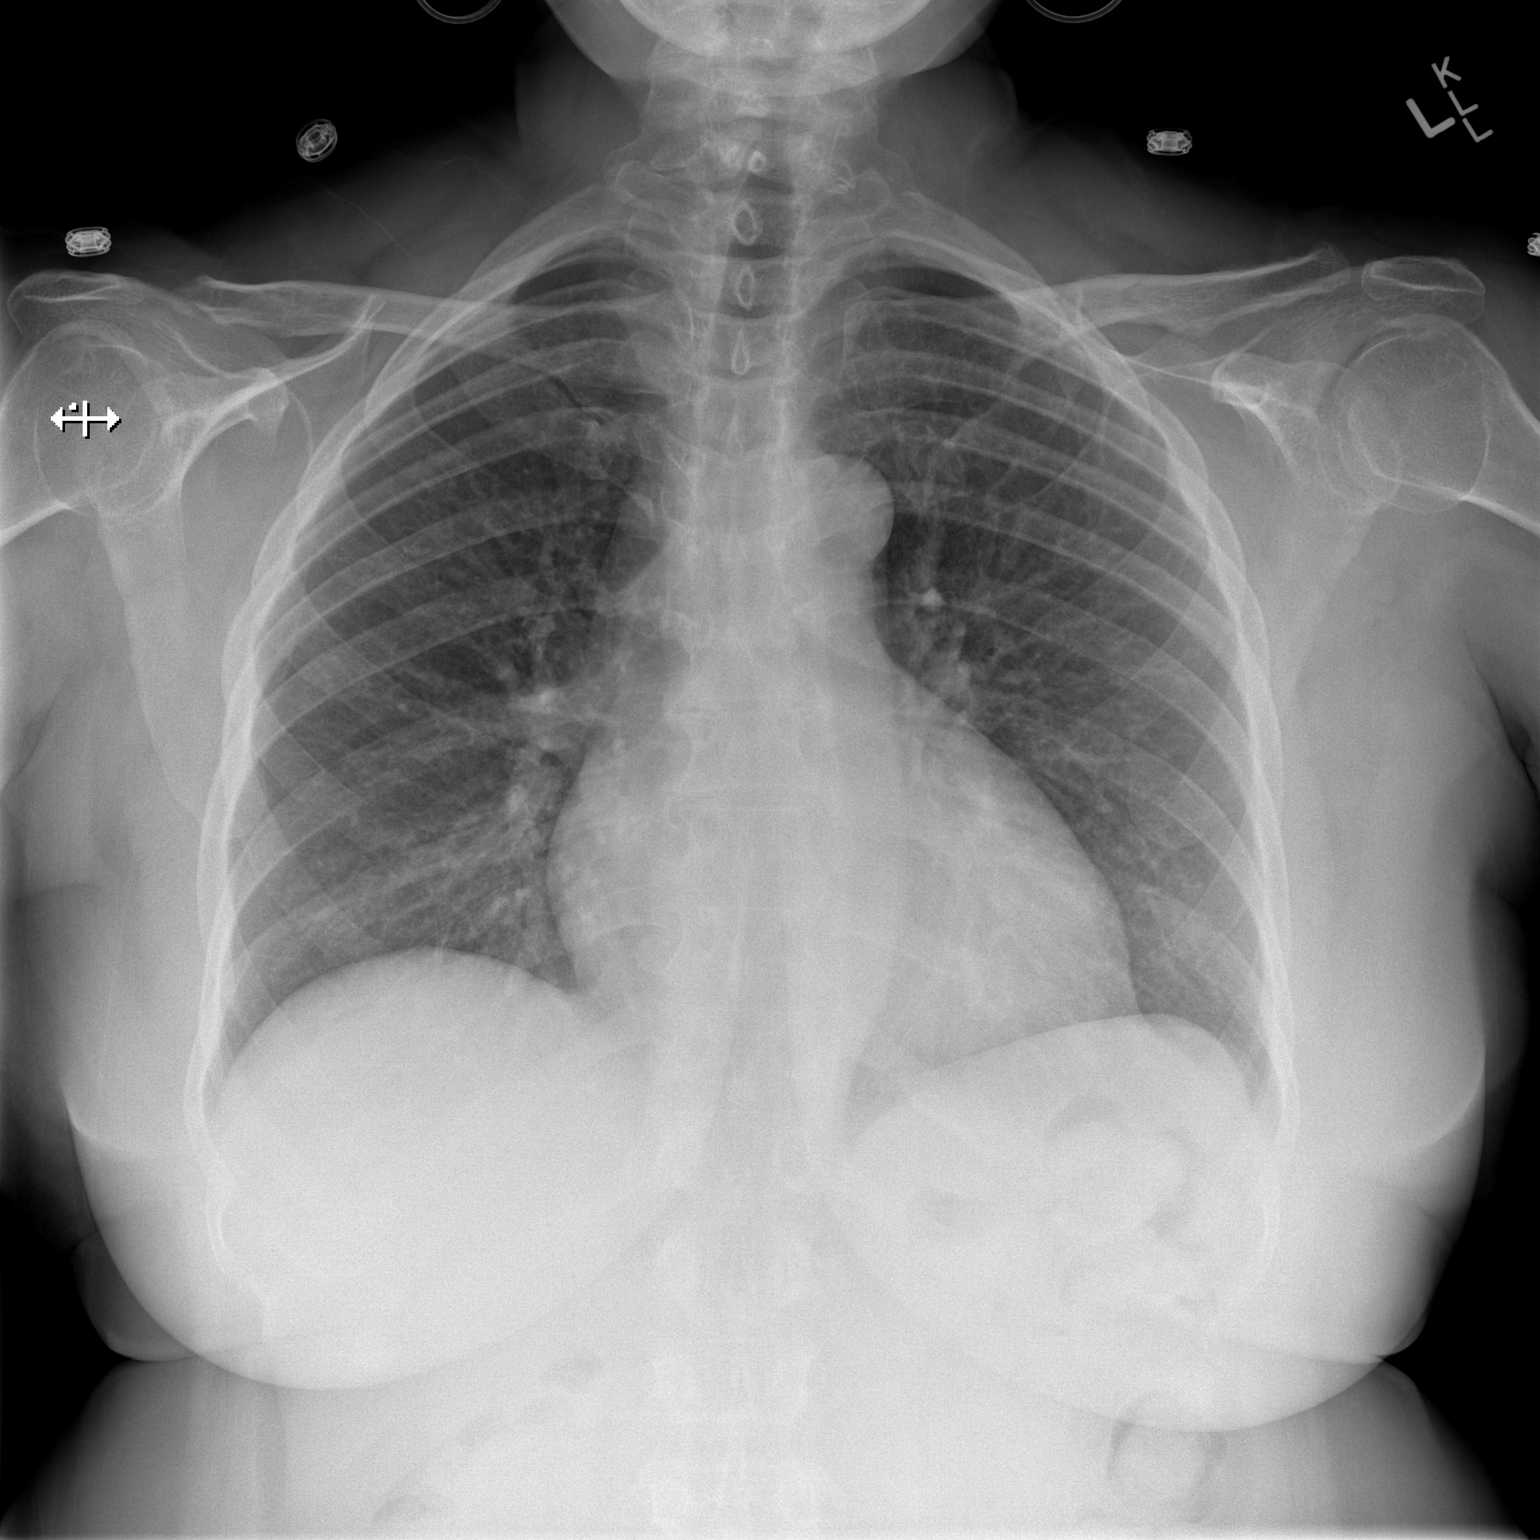

[w chest lat]
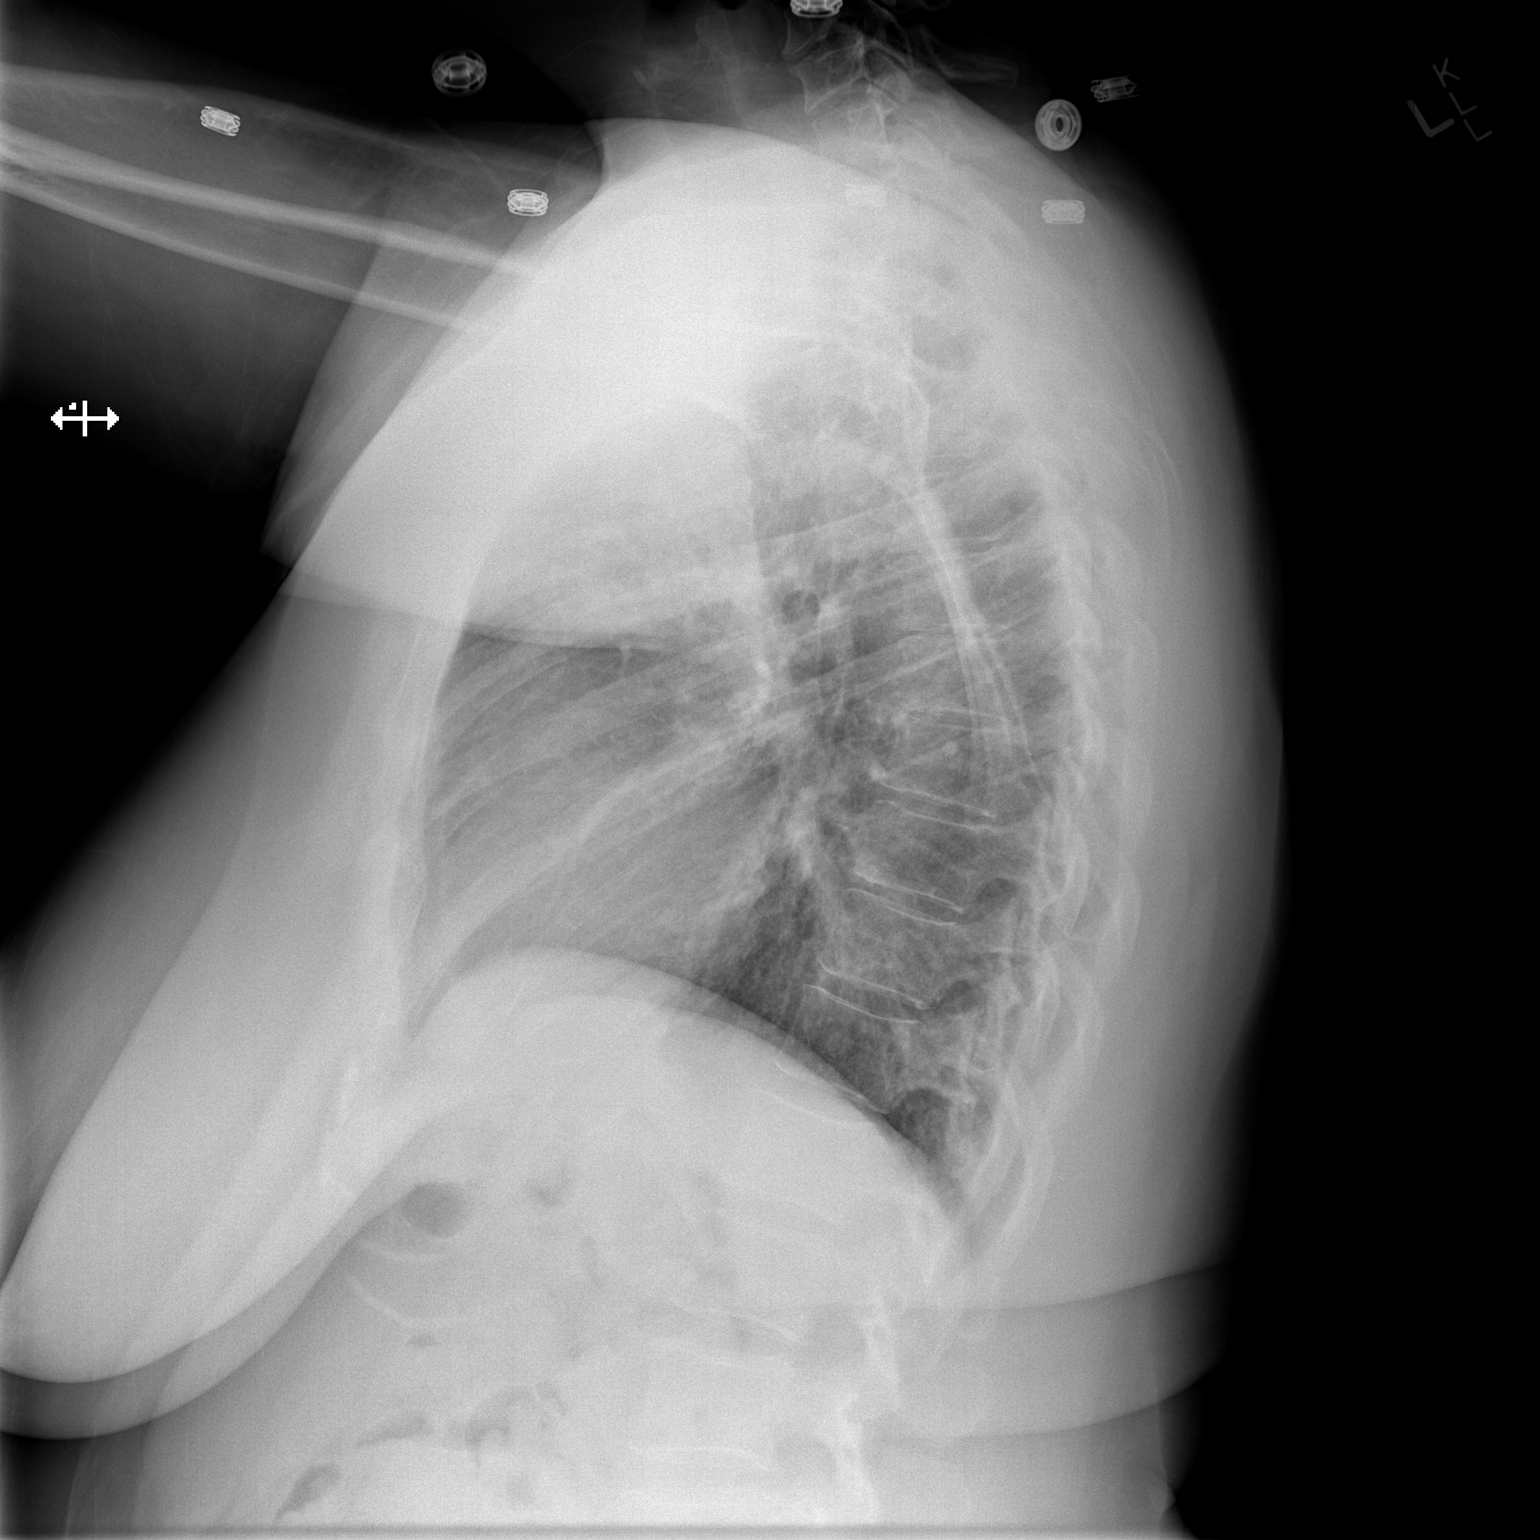

[2 of 2 positions shown; findings below may reference images not displayed]

FINDINGS: The heart size and mediastinal contours are within normal limits.
Both lungs are clear. The visualized skeletal structures are
unremarkable.
IMPRESSION: No active cardiopulmonary disease.
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
## 2020-09-10 MED ORDER — IBUPROFEN 200 MG PO TABS
600.0000 mg | ORAL_TABLET | Freq: Once | ORAL | Status: AC
  Administered 2020-09-10: 600 mg via ORAL
  Filled 2020-09-10: qty 3

## 2020-09-10 MED ORDER — IBUPROFEN 600 MG PO TABS: 600.0000 mg | ORAL_TABLET | Freq: Four times a day (QID) | ORAL | 0 refills | Status: DC | PRN
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
## 2020-09-13 ENCOUNTER — Ambulatory Visit (INDEPENDENT_AMBULATORY_CARE_PROVIDER_SITE_OTHER): Payer: 59 | Admitting: Family Medicine

## 2020-09-13 DIAGNOSIS — M545 Low back pain, unspecified: Secondary | ICD-10-CM | POA: Diagnosis not present

## 2020-09-13 DIAGNOSIS — M546 Pain in thoracic spine: Secondary | ICD-10-CM

## 2020-09-13 DIAGNOSIS — M25511 Pain in right shoulder: Secondary | ICD-10-CM

## 2020-09-13 DIAGNOSIS — M542 Cervicalgia: Secondary | ICD-10-CM | POA: Diagnosis not present

## 2020-09-13 DIAGNOSIS — M79605 Pain in left leg: Secondary | ICD-10-CM

## 2020-09-13 DIAGNOSIS — M79604 Pain in right leg: Secondary | ICD-10-CM

## 2020-09-13 MED ORDER — BACLOFEN 10 MG PO TABS
5.0000 mg | ORAL_TABLET | Freq: Three times a day (TID) | ORAL | 3 refills | Status: DC | PRN
Start: 2020-09-13 — End: 2020-10-08

## 2020-09-13 MED ORDER — NABUMETONE 500 MG PO TABS: 500.0000 mg | ORAL_TABLET | Freq: Two times a day (BID) | ORAL | 3 refills | Status: DC | PRN
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
## 2020-09-14 NOTE — Telephone Encounter (Signed)
No response from pt.  Closed referral  

## 2020-09-20 ENCOUNTER — Telehealth: Payer: Self-pay | Admitting: Family Medicine

## 2020-09-20 DIAGNOSIS — M79604 Pain in right leg: Secondary | ICD-10-CM

## 2020-09-20 DIAGNOSIS — M546 Pain in thoracic spine: Secondary | ICD-10-CM

## 2020-09-20 DIAGNOSIS — M545 Low back pain, unspecified: Secondary | ICD-10-CM

## 2020-09-20 DIAGNOSIS — M25511 Pain in right shoulder: Secondary | ICD-10-CM

## 2020-09-20 DIAGNOSIS — M79605 Pain in left leg: Secondary | ICD-10-CM

## 2020-09-20 DIAGNOSIS — M542 Cervicalgia: Secondary | ICD-10-CM

## 2020-09-20 NOTE — Telephone Encounter (Signed)
Pt called and would like to for Terri to give her a call
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
## 2020-09-21 NOTE — Telephone Encounter (Signed)
I called and advsied Shirley Hubbard that new orders for PT have been placed for them, she states that someone has already called her.

## 2020-09-21 NOTE — Telephone Encounter (Signed)
New PT orders placed for each of them.

## 2020-09-22 ENCOUNTER — Telehealth: Payer: Self-pay | Admitting: Family Medicine

## 2020-09-22 ENCOUNTER — Other Ambulatory Visit: Payer: Self-pay | Admitting: Family Medicine

## 2020-09-22 NOTE — Telephone Encounter (Signed)
[employer] forms received. Sent to [facility] 

## 2020-09-29 ENCOUNTER — Ambulatory Visit: Payer: 59 | Attending: Family Medicine | Admitting: Physical Therapy

## 2020-09-29 ENCOUNTER — Other Ambulatory Visit: Payer: Self-pay

## 2020-09-29 ENCOUNTER — Encounter: Payer: Self-pay | Admitting: Physical Therapy

## 2020-09-29 DIAGNOSIS — M546 Pain in thoracic spine: Secondary | ICD-10-CM | POA: Diagnosis present

## 2020-09-29 DIAGNOSIS — G44311 Acute post-traumatic headache, intractable: Secondary | ICD-10-CM | POA: Insufficient documentation

## 2020-09-29 DIAGNOSIS — M5442 Lumbago with sciatica, left side: Secondary | ICD-10-CM | POA: Diagnosis present

## 2020-09-29 DIAGNOSIS — M25512 Pain in left shoulder: Secondary | ICD-10-CM | POA: Insufficient documentation

## 2020-09-29 DIAGNOSIS — M5441 Lumbago with sciatica, right side: Secondary | ICD-10-CM | POA: Diagnosis present

## 2020-09-29 DIAGNOSIS — M542 Cervicalgia: Secondary | ICD-10-CM | POA: Insufficient documentation

## 2020-09-29 DIAGNOSIS — M25511 Pain in right shoulder: Secondary | ICD-10-CM | POA: Diagnosis present

## 2020-09-29 DIAGNOSIS — R293 Abnormal posture: Secondary | ICD-10-CM | POA: Diagnosis present
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
## 2020-10-01 ENCOUNTER — Ambulatory Visit: Payer: 59 | Admitting: Rehabilitative and Restorative Service Providers"

## 2020-10-05 ENCOUNTER — Other Ambulatory Visit: Payer: Self-pay

## 2020-10-05 ENCOUNTER — Ambulatory Visit: Payer: 59

## 2020-10-05 DIAGNOSIS — M542 Cervicalgia: Secondary | ICD-10-CM

## 2020-10-05 DIAGNOSIS — M25511 Pain in right shoulder: Secondary | ICD-10-CM

## 2020-10-05 DIAGNOSIS — G44311 Acute post-traumatic headache, intractable: Secondary | ICD-10-CM

## 2020-10-05 DIAGNOSIS — M5442 Lumbago with sciatica, left side: Secondary | ICD-10-CM | POA: Diagnosis not present

## 2020-10-05 DIAGNOSIS — M546 Pain in thoracic spine: Secondary | ICD-10-CM

## 2020-10-05 DIAGNOSIS — M25512 Pain in left shoulder: Secondary | ICD-10-CM

## 2020-10-05 DIAGNOSIS — M5441 Lumbago with sciatica, right side: Secondary | ICD-10-CM

## 2020-10-05 DIAGNOSIS — R293 Abnormal posture: Secondary | ICD-10-CM

## 2020-10-05 NOTE — Therapy (Signed)
[facility] [address]  [place], [place], [address] Phone: [phone]   Fax:  [phone]  Physical Therapy Treatment  Patient Details  Name: Shirley Hubbard MRN: [id number] Date of Birth: Jul 19, 1961 Referring Provider (PT): Eunice Blase, MD   Encounter Date: 10/05/2020   PT End of Session - 10/05/20 1328    Visit Number 2    Number of Visits 17    Date for PT Re-Evaluation 11/24/20    Authorization Type A[employer]    PT Start Time 1318    PT Stop Time 1412   ended visit with 15 min moist heat   PT Time Calculation (min) 54 min    Activity Tolerance Patient tolerated treatment well;Patient limited by pain    Behavior During Therapy [facility] for tasks assessed/performed           Past Medical History:  Diagnosis Date  . Hyperlipidemia   . Hypertension    on medication but cant remember the name  . Lipoma   . Seroma    axillary    Past Surgical History:  Procedure Laterality Date  . ABDOMINAL HYSTERECTOMY  2000  . LIPOMA EXCISION     back  . seroma aspiration      There were no vitals filed for this visit.   Subjective Assessment - 10/05/20 1321    Subjective Pt. noting she was able to perform HEP daily since last visit.    Pertinent History HTN, HLD    Diagnostic tests 09/10/20 chest xray: negative; 09/10/20 head CT: negative    Patient Stated Goals decrease pain    Currently in Pain? Yes    Pain Score 8     Pain Location Neck    Pain Orientation Right;Left;Posterior    Pain Descriptors / Indicators Dull;Tightness    Pain Type Acute pain    Pain Radiating Towards posterior base of the skull to B temples    Aggravating Factors  unsure    Pain Relieving Factors medication    Multiple Pain Sites Yes    Pain Score 9    Pain Location Back    Pain Orientation Right;Left;Mid;Lower;Upper    Pain Descriptors / Indicators Sharp;Dull    Pain Type Acute pain    Pain Radiating Towards B LEs to feet  (R>L)    Pain Onset 1 to 4 weeks ago    Pain Frequency Constant    Aggravating Factors  prolonged standing, bending too far    Pain Relieving Factors medication, cushion behind back    Pain Score 8    Pain Location Shoulder    Pain Orientation Right;Left    Pain Descriptors / Indicators Dull    Pain Type Acute pain    Pain Radiating Towards down UEs into muscular    Pain Onset 1 to 4 weeks ago    Pain Frequency Constant    Aggravating Factors  unsure    Pain Relieving Factors unsure              [facility] PT Assessment - 10/05/20 0001      AROM   Right/Left Shoulder Right;Left    Right Shoulder Flexion 84 Degrees   anterior shoulder pain   Right Shoulder ABduction 76 Degrees    Right Shoulder Internal Rotation --   FIR to beltline - anterior shoulder pain   Right Shoulder External Rotation 70 Degrees   anteriorr shoulder pain   Left Shoulder Flexion 80 Degrees  anterior shoulder pain   Left Shoulder ABduction 71 Degrees   lateral shoulder pain   Left Shoulder Internal Rotation --   FIR to beltline - anterior shoulder pain   Left Shoulder External Rotation 56 Degrees   anterior shoulder pain                        [facility] Adult PT Treatment/Exercise - 10/05/20 0001      Neck Exercises: Standing   Other Standing Exercises B shoulder circles x 15   cues for increased ROM required     Neck Exercises: Seated   Other Seated Exercise seated shoulder circles x 10      Lumbar Exercises: Stretches   Lower Trunk Rotation Limitations 5" x 10 reps      Knee/Hip Exercises: Aerobic   Nustep Lvl 3, 3 min (UE/LE)O      Shoulder Exercises: Pulleys   Flexion 3 minutes    Flexion Limitations small ROM    Scaption 3 minutes    Scaption Limitations small ROM      Modalities   Modalities Moist Heat      Moist Heat Therapy   Number Minutes Moist Heat 15 Minutes    Moist Heat Location Lumbar Spine   and thoracic and cervical spine                   PT Short  Term Goals - 10/05/20 1328      PT SHORT TERM GOAL #1   Title Patient to be independent with initial HEP.    Time 3    Period Weeks    Status Achieved    Target Date 10/20/20             PT Long Term Goals - 10/05/20 1328      PT LONG TERM GOAL #1   Title Patient to be independent with advanced HEP.    Time 8    Period Weeks    Status On-going      PT LONG TERM GOAL #2   Title Patient to demonstrate cervical and lumbar AROM WFL and with mild pain remaining.    Time 8    Period Weeks    Status On-going      PT LONG TERM GOAL #3   Title Patient to demonstrate B shoulder AROM WFL and without pain limiting.    Time 8    Period Weeks    Status On-going      PT LONG TERM GOAL #4   Title Patient to demonstrate B UE and LE strength >/=4+/5.    Time 6    Period Weeks    Status On-going      PT LONG TERM GOAL #5   Title Patient to report tolerance of 2 hours on her feet without pain limiting.    Time 8    Period Weeks    Status On-going      PT LONG TERM GOAL #6   Title Patient to demonstrate good body mechanics and no pain when lifting 20lbs box from floor to improve tolerance for liting at work.    Time 8    Period Weeks    Status On-going                 Plan - 10/05/20 1329    Clinical Impression Statement Shirley Hubbard with high initial subjective complaint of LBP, B shoulder pain, neck pain.  Had no issues with initial HEP  and able to demo good understanding of these activities in session with review.  Progressed gentle lumbar ROM activities with pt. demonstrating limited ROM secondary to pain with LTR.  Pt. demonstrating severely limited B shoulder ROM secondary to complaint of B anterior shoulder pain.  Ended visit with moist heat to lumbar/thoracic/cervical spine for muscular relaxation and pain relief.  STG #1 met.    Comorbidities HTN, HLD    Rehab Potential Good    PT Frequency 2x / week    PT Duration 8 weeks    PT Treatment/Interventions ADLs/Self Care  Home Management;Cryotherapy;Electrical Stimulation;Iontophoresis 52m/ml Dexamethasone;Moist Heat;Traction;Balance training;Therapeutic exercise;Therapeutic activities;Functional mobility training;Stair training;Gait training;Ultrasound;Neuromuscular re-education;Patient/family education;Canalith Repostioning;Manual techniques;Vestibular;Vasopneumatic Device;Taping;Energy conservation;Dry needling;Passive range of motion    PT Next Visit Plan Progress cervicothoracolumbar ROM, and B UE/LE strengthening to tolerance    Consulted and Agree with Plan of Care Patient           Patient will benefit from skilled therapeutic intervention in order to improve the following deficits and impairments:  Hypomobility,Decreased endurance,Decreased activity tolerance,Decreased strength,Increased fascial restricitons,Pain,Impaired UE functional use,Decreased balance,Decreased mobility,Difficulty walking,Increased muscle spasms,Improper body mechanics,Impaired perceived functional ability,Decreased range of motion,Postural dysfunction,Impaired flexibility  Visit Diagnosis: Acute bilateral low back pain with bilateral sciatica  Pain in thoracic spine  Cervicalgia  Intractable acute post-traumatic headache  Acute pain of left shoulder  Acute pain of right shoulder  Abnormal posture     Problem List Patient Active Problem List   Diagnosis Date Noted  . Moderate persistent asthma without complication 004/01/5912 . Impaired cognition 02/16/2020  . Restrictive lung disease 10/13/2019  . Chronic cough 10/13/2019  . Carotid artery stenosis 09/22/2019  . OSA (obstructive sleep apnea) 09/21/2019  . History of 2019 novel coronavirus disease (COVID-19) 09/21/2019  . Shortness of breath 09/21/2019  . Hematuria 04/09/2019  . Chronic nonintractable headache 04/09/2019  . Fecal occult blood test positive 03/12/2019  . Suspected sleep apnea 12/05/2017  . Snoring 08/24/2017  . IFG (impaired fasting glucose)  09/28/2016  . Mixed hyperlipidemia 09/28/2016  . Dystrophic nail 09/28/2016  . Acute midline low back pain without sciatica 08/22/2016  . Spondylosis of cervical region without myelopathy or radiculopathy 08/22/2016  . Thyroid nodule 07/27/2016  . Chronic pain syndrome 02/20/2016  . Anxiety 11/16/2015  . Benign essential hypertension 09/14/2015  . Iron deficiency anemia due to chronic blood loss 06/16/2015  . Trigeminal neuralgia of left side of face 06/16/2015  . Allergic contact dermatitis 05/28/2015  . Hematoma - postoperative 05/16/2011  . Mass of shoulder, L scapular area  04/17/2011    MBess Harvest PTA 10/05/20 2:07 PM   [facility] [address] [place] [address]Phone: [phone]  Fax:  [phone] Name: RNailea WhitehornMRN: [id number] of Birth: 9Oct 08, 1962

## 2020-10-07 ENCOUNTER — Ambulatory Visit: Payer: 59 | Admitting: Physical Therapy

## 2020-10-07 ENCOUNTER — Other Ambulatory Visit: Payer: Self-pay

## 2020-10-07 ENCOUNTER — Encounter: Payer: Self-pay | Admitting: Physical Therapy

## 2020-10-07 DIAGNOSIS — M25512 Pain in left shoulder: Secondary | ICD-10-CM

## 2020-10-07 DIAGNOSIS — M5442 Lumbago with sciatica, left side: Secondary | ICD-10-CM

## 2020-10-07 DIAGNOSIS — M542 Cervicalgia: Secondary | ICD-10-CM

## 2020-10-07 DIAGNOSIS — M25511 Pain in right shoulder: Secondary | ICD-10-CM

## 2020-10-07 DIAGNOSIS — M546 Pain in thoracic spine: Secondary | ICD-10-CM

## 2020-10-07 DIAGNOSIS — G44311 Acute post-traumatic headache, intractable: Secondary | ICD-10-CM

## 2020-10-07 DIAGNOSIS — R293 Abnormal posture: Secondary | ICD-10-CM

## 2020-10-07 NOTE — Therapy (Signed)
[facility] [address]  [place], [place], [address] Phone: [phone]   Fax:  [phone]  Physical Therapy Treatment  Patient Details  Name: Shirley Hubbard MRN: [id number] Date of Birth: [birth year] Referring Provider (PT): Eunice Blase, MD   Encounter Date: 10/07/2020   PT End of Session - 10/07/20 1148    Visit Number 3    Number of Visits 17    Date for PT Re-Evaluation 11/24/20    Authorization Type [employer]    PT Start Time 1104    PT Stop Time 1157    PT Time Calculation (min) 53 min    Activity Tolerance Patient tolerated treatment well;Patient limited by pain    Behavior During Therapy [facility] for tasks assessed/performed           Past Medical History:  Diagnosis Date  . Hyperlipidemia   . Hypertension    on medication but cant remember the name  . Lipoma   . Seroma    axillary    Past Surgical History:  Procedure Laterality Date  . ABDOMINAL HYSTERECTOMY  2000  . LIPOMA EXCISION     back  . seroma aspiration      There were no vitals filed for this visit.   Subjective Assessment - 10/07/20 1105    Subjective Still having a lot of pain. Has been able to do her exercises and denies questions.    Pertinent History HTN, HLD    Diagnostic tests 09/10/20 chest xray: negative; 09/10/20 head CT: negative    Patient Stated Goals decrease pain    Currently in Pain? Yes    Pain Score 8    8.5   Pain Location Shoulder    Pain Descriptors / Indicators Burning    Pain Type Acute pain    Pain Radiating Towards down B UEs to forearms    Pain Score 8   8.5   Pain Location Back    Pain Orientation Right;Left;Lower    Pain Descriptors / Indicators Dull;Sharp    Pain Radiating Towards down to R>L LE                             [facility] Adult PT Treatment/Exercise - 10/07/20 0001      Exercises   Exercises Lumbar      Neck Exercises: Seated   Cervical Rotation  Right;Left;5 reps    Cervical Rotation Limitations 2x5 SNAG to tolerance    Shoulder Rolls Backwards;Forwards;10 reps    Shoulder Rolls Limitations cues for increased ROM    Other Seated Exercise cervical extension SNAG 5x to tolerance    Other Seated Exercise scapular retraction 10x3"   cues to avoid shoulder elevation     Lumbar Exercises: Standing   Functional Squats 10 reps    Functional Squats Limitations mini squat at counter   good form after instruction   Row Strengthening;Both;10 reps;Theraband    Theraband Level (Row) Level 1 (Yellow)    Row Limitations 2x10; great form    Other Standing Lumbar Exercises high knee march at counter top 2x20   c/o most pain over R LE     Knee/Hip Exercises: Aerobic   Nustep Lvl 3, 3 min (UE/LE)      Modalities   Modalities Moist Heat;Electrical Stimulation      Moist Heat Therapy   Number Minutes Moist Heat 15 Minutes    Moist Heat Location Cervical  Theme park manager B UT    Electrical Stimulation Action IFC    Electrical Stimulation Parameters 80-150hz ; output to tolerance; 15 min    Electrical Stimulation Goals Tone;Pain                    PT Short Term Goals - 10/05/20 1328      PT SHORT TERM GOAL #1   Title Patient to be independent with initial HEP.    Time 3    Period Weeks    Status Achieved    Target Date 10/20/20             PT Long Term Goals - 10/05/20 1328      PT LONG TERM GOAL #1   Title Patient to be independent with advanced HEP.    Time 8    Period Weeks    Status On-going      PT LONG TERM GOAL #2   Title Patient to demonstrate cervical and lumbar AROM WFL and with mild pain remaining.    Time 8    Period Weeks    Status On-going      PT LONG TERM GOAL #3   Title Patient to demonstrate B shoulder AROM WFL and without pain limiting.    Time 8    Period Weeks    Status On-going      PT LONG TERM GOAL #4   Title Patient to demonstrate B UE  and LE strength >/=4+/5.    Time 6    Period Weeks    Status On-going      PT LONG TERM GOAL #5   Title Patient to report tolerance of 2 hours on her feet without pain limiting.    Time 8    Period Weeks    Status On-going      PT LONG TERM GOAL #6   Title Patient to demonstrate good body mechanics and no pain when lifting 20lbs box from floor to improve tolerance for liting at work.    Time 8    Period Weeks    Status On-going                 Plan - 10/07/20 1149    Clinical Impression Statement Patient without new complaints today, but still noting severe pain in B shoulders to arms as well as LB to B LEs, R>L. Worked on gentle cervical and shoulder stretching to patient's tolerance. Very limited cervical ROM demonstrated in all planes today. Initiated standing LE strengthening ther-ex with patient noting pain with low-level ther-ex, particularly over R vs. L LE. Great form demonstrated with initiation of mini squats and banded rows. Patient required sitting rest break intermittently between standing ther-ex. Ended session with e-stim and moist heat to neck for pain-relief. Patient reported good relief and no complaints at end of session.    Comorbidities HTN, HLD    Rehab Potential Good    PT Frequency 2x / week    PT Duration 8 weeks    PT Treatment/Interventions ADLs/Self Care Home Management;Cryotherapy;Electrical Stimulation;Iontophoresis 4mg /ml Dexamethasone;Moist Heat;Traction;Balance training;Therapeutic exercise;Therapeutic activities;Functional mobility training;Stair training;Gait training;Ultrasound;Neuromuscular re-education;Patient/family education;Canalith Repostioning;Manual techniques;Vestibular;Vasopneumatic Device;Taping;Energy conservation;Dry needling;Passive range of motion    PT Next Visit Plan Progress cervicothoracolumbar ROM, and B UE/LE strengthening to tolerance    Consulted and Agree with Plan of Care Patient           Patient will benefit from  skilled therapeutic intervention in order to improve the  following deficits and impairments:  Hypomobility,Decreased endurance,Decreased activity tolerance,Decreased strength,Increased fascial restricitons,Pain,Impaired UE functional use,Decreased balance,Decreased mobility,Difficulty walking,Increased muscle spasms,Improper body mechanics,Impaired perceived functional ability,Decreased range of motion,Postural dysfunction,Impaired flexibility  Visit Diagnosis: Acute bilateral low back pain with bilateral sciatica  Pain in thoracic spine  Cervicalgia  Intractable acute post-traumatic headache  Acute pain of left shoulder  Acute pain of right shoulder  Abnormal posture     Problem List Patient Active Problem List   Diagnosis Date Noted  . Moderate persistent asthma without complication 16/38/4536  . Impaired cognition 02/16/2020  . Restrictive lung disease 10/13/2019  . Chronic cough 10/13/2019  . Carotid artery stenosis 09/22/2019  . OSA (obstructive sleep apnea) 09/21/2019  . History of 2019 novel coronavirus disease (COVID-19) 09/21/2019  . Shortness of breath 09/21/2019  . Hematuria 04/09/2019  . Chronic nonintractable headache 04/09/2019  . Fecal occult blood test positive 03/12/2019  . Suspected sleep apnea 12/05/2017  . Snoring 08/24/2017  . IFG (impaired fasting glucose) 09/28/2016  . Mixed hyperlipidemia 09/28/2016  . Dystrophic nail 09/28/2016  . Acute midline low back pain without sciatica 08/22/2016  . Spondylosis of cervical region without myelopathy or radiculopathy 08/22/2016  . Thyroid nodule 07/27/2016  . Chronic pain syndrome 02/20/2016  . Anxiety 11/16/2015  . Benign essential hypertension 09/14/2015  . Iron deficiency anemia due to chronic blood loss 06/16/2015  . Trigeminal neuralgia of left side of face 06/16/2015  . Allergic contact dermatitis 05/28/2015  . Hematoma - postoperative 05/16/2011  . Mass of shoulder, L scapular area  04/17/2011      Janene Harvey, PT, DPT 10/07/20 12:01 PM   [facility] [address]  [place], [place], [address] Phone: [phone]   Fax:  [phone]  Name: Shirley Hubbard MRN: [id number] Date of Birth: 11-16-1960

## 2020-10-08 ENCOUNTER — Encounter: Payer: Self-pay | Admitting: Family Medicine

## 2020-10-08 ENCOUNTER — Ambulatory Visit (INDEPENDENT_AMBULATORY_CARE_PROVIDER_SITE_OTHER): Payer: 59 | Admitting: Family Medicine

## 2020-10-08 DIAGNOSIS — M542 Cervicalgia: Secondary | ICD-10-CM

## 2020-10-08 DIAGNOSIS — M545 Low back pain, unspecified: Secondary | ICD-10-CM | POA: Diagnosis not present

## 2020-10-08 DIAGNOSIS — M546 Pain in thoracic spine: Secondary | ICD-10-CM

## 2020-10-08 MED ORDER — NABUMETONE 500 MG PO TABS: 500.0000 mg | ORAL_TABLET | Freq: Two times a day (BID) | ORAL | 3 refills | Status: AC | PRN

## 2020-10-08 MED ORDER — BACLOFEN 10 MG PO TABS
5.0000 mg | ORAL_TABLET | Freq: Three times a day (TID) | ORAL | 3 refills | Status: AC | PRN
Start: 2020-10-08 — End: ?
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
## 2020-10-11 ENCOUNTER — Other Ambulatory Visit: Payer: Self-pay | Admitting: Family Medicine

## 2020-10-12 ENCOUNTER — Encounter: Payer: Self-pay | Admitting: Physical Therapy

## 2020-10-12 ENCOUNTER — Ambulatory Visit: Payer: 59 | Admitting: Physical Therapy

## 2020-10-12 ENCOUNTER — Other Ambulatory Visit: Payer: Self-pay

## 2020-10-12 DIAGNOSIS — M25512 Pain in left shoulder: Secondary | ICD-10-CM

## 2020-10-12 DIAGNOSIS — M5442 Lumbago with sciatica, left side: Secondary | ICD-10-CM

## 2020-10-12 DIAGNOSIS — M542 Cervicalgia: Secondary | ICD-10-CM

## 2020-10-12 DIAGNOSIS — M25511 Pain in right shoulder: Secondary | ICD-10-CM

## 2020-10-12 DIAGNOSIS — G44311 Acute post-traumatic headache, intractable: Secondary | ICD-10-CM

## 2020-10-12 DIAGNOSIS — R293 Abnormal posture: Secondary | ICD-10-CM

## 2020-10-12 DIAGNOSIS — M546 Pain in thoracic spine: Secondary | ICD-10-CM
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
## 2020-10-14 ENCOUNTER — Ambulatory Visit: Payer: 59 | Admitting: Physical Therapy

## 2020-10-14 ENCOUNTER — Other Ambulatory Visit: Payer: Self-pay

## 2020-10-14 ENCOUNTER — Encounter: Payer: Self-pay | Admitting: Physical Therapy

## 2020-10-14 DIAGNOSIS — R293 Abnormal posture: Secondary | ICD-10-CM

## 2020-10-14 DIAGNOSIS — M5441 Lumbago with sciatica, right side: Secondary | ICD-10-CM

## 2020-10-14 DIAGNOSIS — G44311 Acute post-traumatic headache, intractable: Secondary | ICD-10-CM

## 2020-10-14 DIAGNOSIS — M25512 Pain in left shoulder: Secondary | ICD-10-CM

## 2020-10-14 DIAGNOSIS — M546 Pain in thoracic spine: Secondary | ICD-10-CM

## 2020-10-14 DIAGNOSIS — M5442 Lumbago with sciatica, left side: Secondary | ICD-10-CM | POA: Diagnosis not present

## 2020-10-14 DIAGNOSIS — M25511 Pain in right shoulder: Secondary | ICD-10-CM

## 2020-10-14 DIAGNOSIS — M542 Cervicalgia: Secondary | ICD-10-CM

## 2020-10-14 NOTE — Therapy (Signed)
[facility] [address]  [place], [place], [address] Phone: [phone]   Fax:  [phone]  Physical Therapy Treatment  Patient Details  Name: Shirley Hubbard MRN: [id number] Date of Birth: Feb 06, 1961 Referring Provider (PT): Eunice Blase, MD   Encounter Date: 10/14/2020   PT End of Session - 10/14/20 1132    Visit Number 5    Number of Visits 17    Date for PT Re-Evaluation 11/24/20    Authorization Type A[employer]    PT Start Time 1055    PT Stop Time 1131    PT Time Calculation (min) 36 min    Activity Tolerance Patient tolerated treatment well;Patient limited by pain    Behavior During Therapy [facility] for tasks assessed/performed           Past Medical History:  Diagnosis Date  . Hyperlipidemia   . Hypertension    on medication but cant remember the name  . Lipoma   . Seroma    axillary    Past Surgical History:  Procedure Laterality Date  . ABDOMINAL HYSTERECTOMY  2000  . LIPOMA EXCISION     back  . seroma aspiration      There were no vitals filed for this visit.   Subjective Assessment - 10/14/20 1057    Subjective Last night she has she woke up with exruciating pain in the L calf- improved with pain meds. Also noted some hip pain after last session. Got good relief from [employer] last session.    Pertinent History HTN, HLD    Diagnostic tests 09/10/20 chest xray: negative; 09/10/20 head CT: negative    Patient Stated Goals decrease pain    Currently in Pain? Yes    Pain Score 7    Pain Location Shoulder    Pain Orientation Right    Pain Descriptors / Indicators Aching    Pain Type Acute pain    Pain Radiating Towards R shoulder to posterior head                             [facility] Adult PT Treatment/Exercise - 10/14/20 0001      Neck Exercises: Seated   Neck Retraction 10 reps    Neck Retraction Limitations 10x3"   limited ROM     Neck Exercises: Supine   Neck  Retraction 10 reps;3 secs    Neck Retraction Limitations into towel roll    Other Supine Exercise supine B horizontal ABD yellow TB 10x; supine B shoulder ER with yellow 10x   slow, limited ROM     Lumbar Exercises: Aerobic   Nustep L3x6 min (UEs/LEs)      Manual Therapy   Manual Therapy Soft tissue mobilization;Myofascial release;Passive ROM    Manual therapy comments supine    Soft tissue mobilization STM to B cervical paraspinals    Myofascial Release B suboccipital release    Passive ROM R/L suboccipital stretch 2x30" to tolerance   very limited ROM                 PT Education - 10/14/20 1132    Education Details update to HEP    Person(s) Educated Patient    Methods Explanation;Demonstration;Tactile cues;Verbal cues;Handout    Comprehension Verbalized understanding;Returned demonstration            PT Short Term Goals - 10/05/20 1328      PT SHORT TERM GOAL #  1   Title Patient to be independent with initial HEP.    Time 3    Period Weeks    Status Achieved    Target Date 10/20/20             PT Long Term Goals - 10/05/20 1328      PT LONG TERM GOAL #1   Title Patient to be independent with advanced HEP.    Time 8    Period Weeks    Status On-going      PT LONG TERM GOAL #2   Title Patient to demonstrate cervical and lumbar AROM WFL and with mild pain remaining.    Time 8    Period Weeks    Status On-going      PT LONG TERM GOAL #3   Title Patient to demonstrate B shoulder AROM WFL and without pain limiting.    Time 8    Period Weeks    Status On-going      PT LONG TERM GOAL #4   Title Patient to demonstrate B UE and LE strength >/=4+/5.    Time 6    Period Weeks    Status On-going      PT LONG TERM GOAL #5   Title Patient to report tolerance of 2 hours on her feet without pain limiting.    Time 8    Period Weeks    Status On-going      PT LONG TERM GOAL #6   Title Patient to demonstrate good body mechanics and no pain when lifting  20lbs box from floor to improve tolerance for liting at work.    Time 8    Period Weeks    Status On-going                 Plan - 10/14/20 1133    Clinical Impression Statement Patient reporting some pain in the L calf and B hips since last session, which has since resolved. Noted good improvement in HA from MT last session. Initiated sitting and supine cervical retractions with patient demonstrated good carryover but limited ROM. Tolerated STM and passive suboccipital stretching within limited ROM. Patient reported decreased HA to 6/10 after MT. Initiated periscapular strengthening with good tolerance. Ended session early per patient's request d/t her husband having another appointment. No complaints at end of session.    Comorbidities HTN, HLD    Rehab Potential Good    PT Frequency 2x / week    PT Duration 8 weeks    PT Treatment/Interventions ADLs/Self Care Home Management;Cryotherapy;Electrical Stimulation;Iontophoresis 4mg /ml Dexamethasone;Moist Heat;Traction;Balance training;Therapeutic exercise;Therapeutic activities;Functional mobility training;Stair training;Gait training;Ultrasound;Neuromuscular re-education;Patient/family education;Canalith Repostioning;Manual techniques;Vestibular;Vasopneumatic Device;Taping;Energy conservation;Dry needling;Passive range of motion    PT Next Visit Plan STM/suboccipital release for HA relief; Progress cervicothoracolumbar ROM, and B UE/LE strengthening to tolerance    Consulted and Agree with Plan of Care Patient           Patient will benefit from skilled therapeutic intervention in order to improve the following deficits and impairments:  Hypomobility,Decreased endurance,Decreased activity tolerance,Decreased strength,Increased fascial restricitons,Pain,Impaired UE functional use,Decreased balance,Decreased mobility,Difficulty walking,Increased muscle spasms,Improper body mechanics,Impaired perceived functional ability,Decreased range of  motion,Postural dysfunction,Impaired flexibility  Visit Diagnosis: Acute bilateral low back pain with bilateral sciatica  Pain in thoracic spine  Cervicalgia  Intractable acute post-traumatic headache  Acute pain of left shoulder  Acute pain of right shoulder  Abnormal posture     Problem List Patient Active Problem List   Diagnosis Date Noted  .  Moderate persistent asthma without complication 35/59/7416  . Impaired cognition 02/16/2020  . Restrictive lung disease 10/13/2019  . Chronic cough 10/13/2019  . Carotid artery stenosis 09/22/2019  . OSA (obstructive sleep apnea) 09/21/2019  . History of 2019 novel coronavirus disease (COVID-19) 09/21/2019  . Shortness of breath 09/21/2019  . Hematuria 04/09/2019  . Chronic nonintractable headache 04/09/2019  . Fecal occult blood test positive 03/12/2019  . Suspected sleep apnea 12/05/2017  . Snoring 08/24/2017  . IFG (impaired fasting glucose) 09/28/2016  . Mixed hyperlipidemia 09/28/2016  . Dystrophic nail 09/28/2016  . Acute midline low back pain without sciatica 08/22/2016  . Spondylosis of cervical region without myelopathy or radiculopathy 08/22/2016  . Thyroid nodule 07/27/2016  . Chronic pain syndrome 02/20/2016  . Anxiety 11/16/2015  . Benign essential hypertension 09/14/2015  . Iron deficiency anemia due to chronic blood loss 06/16/2015  . Trigeminal neuralgia of left side of face 06/16/2015  . Allergic contact dermatitis 05/28/2015  . Hematoma - postoperative 05/16/2011  . Mass of shoulder, L scapular area  04/17/2011     Janene Harvey, PT, DPT 10/14/20 11:36 AM   [facility] [address]  [place], [place], [address] Phone: [phone]   Fax:  [phone]  Name: Carisma Troupe MRN: [id number] Date of Birth: 1961/07/30

## 2020-10-19 ENCOUNTER — Ambulatory Visit: Payer: 59

## 2020-10-19 ENCOUNTER — Other Ambulatory Visit: Payer: Self-pay

## 2020-10-19 DIAGNOSIS — M5441 Lumbago with sciatica, right side: Secondary | ICD-10-CM

## 2020-10-19 DIAGNOSIS — M25512 Pain in left shoulder: Secondary | ICD-10-CM

## 2020-10-19 DIAGNOSIS — M542 Cervicalgia: Secondary | ICD-10-CM

## 2020-10-19 DIAGNOSIS — M5442 Lumbago with sciatica, left side: Secondary | ICD-10-CM | POA: Diagnosis not present

## 2020-10-19 DIAGNOSIS — M25511 Pain in right shoulder: Secondary | ICD-10-CM

## 2020-10-19 DIAGNOSIS — M546 Pain in thoracic spine: Secondary | ICD-10-CM

## 2020-10-19 DIAGNOSIS — R293 Abnormal posture: Secondary | ICD-10-CM

## 2020-10-19 DIAGNOSIS — G44311 Acute post-traumatic headache, intractable: Secondary | ICD-10-CM

## 2020-10-19 NOTE — Therapy (Signed)
[facility] [address]  [place], [place], [address] Phone: [phone]   Fax:  [phone]  Physical Therapy Treatment  Patient Details  Name: Shirley Hubbard MRN: [id number] Date of Birth: 1961-01-26 Referring Provider (PT): Eunice Blase, MD   Encounter Date: 10/19/2020   PT End of Session - 10/19/20 1323    Visit Number 6    Number of Visits 17    Date for PT Re-Evaluation 11/24/20    Authorization Type Aetna    PT Start Time 1318    PT Stop Time 1400    PT Time Calculation (min) 42 min    Activity Tolerance Patient tolerated treatment well;Patient limited by pain    Behavior During Therapy [facility] for tasks assessed/performed           Past Medical History:  Diagnosis Date  . Hyperlipidemia   . Hypertension    on medication but cant remember the name  . Lipoma   . Seroma    axillary    Past Surgical History:  Procedure Laterality Date  . ABDOMINAL HYSTERECTOMY  2000  . LIPOMA EXCISION     back  . seroma aspiration      There were no vitals filed for this visit.   Subjective Assessment - 10/19/20 1321    Subjective Pt reports her R thigh close to crease in hip is catching and it catches her off guard when it happens. Just started today.    Pertinent History HTN, HLD    Diagnostic tests 09/10/20 chest xray: negative; 09/10/20 head CT: negative    Patient Stated Goals decrease pain    Currently in Pain? Yes    Pain Score 7     Pain Location Shoulder    Pain Orientation Right;Left    Pain Descriptors / Indicators Numbness    Pain Type Acute pain    Pain Radiating Towards down B UEs to forearms    Pain Score 9    Pain Location Hip    Pain Orientation Right;Anterior;Proximal    Pain Descriptors / Indicators --   catching                            [facility] Adult PT Treatment/Exercise - 10/19/20 0001      Lumbar Exercises: Aerobic   Nustep L4x6 min (UEs/LEs)   seat  #9, arms #10     Lumbar Exercises: Supine   Pelvic Tilt 15 reps    Pelvic Tilt Limitations Anterior/posterior with RTB above knees    Clam 10 reps;5 seconds    Clam Limitations Red TB, + x10 unilateral    Bridge 10 reps   slow, limited ROM   Bridge Limitations Red TB      Manual Therapy   Manual Therapy Soft tissue mobilization;Joint mobilization    Manual therapy comments supine    Joint Mobilization R hip LAD    Soft tissue mobilization STM to quads and hip flexors knee bent and straight                  PT Education - 10/19/20 1409    Education Details Red theraband provided for home bridging and supine clamming    Person(s) Educated Patient    Methods Explanation;Demonstration;Tactile cues;Verbal cues    Comprehension Returned demonstration;Verbal cues required;Verbalized understanding;Tactile cues required            PT Short Term Goals -  10/05/20 1328      PT SHORT TERM GOAL #1   Title Patient to be independent with initial HEP.    Time 3    Period Weeks    Status Achieved    Target Date 10/20/20             PT Long Term Goals - 10/05/20 1328      PT LONG TERM GOAL #1   Title Patient to be independent with advanced HEP.    Time 8    Period Weeks    Status On-going      PT LONG TERM GOAL #2   Title Patient to demonstrate cervical and lumbar AROM WFL and with mild pain remaining.    Time 8    Period Weeks    Status On-going      PT LONG TERM GOAL #3   Title Patient to demonstrate B shoulder AROM WFL and without pain limiting.    Time 8    Period Weeks    Status On-going      PT LONG TERM GOAL #4   Title Patient to demonstrate B UE and LE strength >/=4+/5.    Time 6    Period Weeks    Status On-going      PT LONG TERM GOAL #5   Title Patient to report tolerance of 2 hours on her feet without pain limiting.    Time 8    Period Weeks    Status On-going      PT LONG TERM GOAL #6   Title Patient to demonstrate good body mechanics and  no pain when lifting 20lbs box from floor to improve tolerance for liting at work.    Time 8    Period Weeks    Status On-going                 Plan - 10/19/20 1323    Clinical Impression Statement Pt continues to have pain in her shoulders, but was more concerned about right anterior hip and groin pain today. Focused session on gluteal strength and releasing anterior hip. Pt reported improvement in hip post session with more fluid gait pattern.    Comorbidities HTN, HLD    Rehab Potential Good    PT Frequency 2x / week    PT Duration 8 weeks    PT Treatment/Interventions ADLs/Self Care Home Management;Cryotherapy;Electrical Stimulation;Iontophoresis 4mg /ml Dexamethasone;Moist Heat;Traction;Balance training;Therapeutic exercise;Therapeutic activities;Functional mobility training;Stair training;Gait training;Ultrasound;Neuromuscular re-education;Patient/family education;Canalith Repostioning;Manual techniques;Vestibular;Vasopneumatic Device;Taping;Energy conservation;Dry needling;Passive range of motion    PT Next Visit Plan STM/suboccipital release for HA relief; Progress cervicothoracolumbar ROM, and B UE/LE strengthening to tolerance, lumbopelvic rhythm    Consulted and Agree with Plan of Care Patient           Patient will benefit from skilled therapeutic intervention in order to improve the following deficits and impairments:  Hypomobility,Decreased endurance,Decreased activity tolerance,Decreased strength,Increased fascial restricitons,Pain,Impaired UE functional use,Decreased balance,Decreased mobility,Difficulty walking,Increased muscle spasms,Improper body mechanics,Impaired perceived functional ability,Decreased range of motion,Postural dysfunction,Impaired flexibility  Visit Diagnosis: Acute bilateral low back pain with bilateral sciatica  Pain in thoracic spine  Cervicalgia  Intractable acute post-traumatic headache  Acute pain of left shoulder  Acute pain of right  shoulder  Abnormal posture     Problem List Patient Active Problem List   Diagnosis Date Noted  . Moderate persistent asthma without complication 0000000  . Impaired cognition 02/16/2020  . Restrictive lung disease 10/13/2019  . Chronic cough 10/13/2019  . Carotid artery stenosis 09/22/2019  .  OSA (obstructive sleep apnea) 09/21/2019  . History of 2019 novel coronavirus disease (COVID-19) 09/21/2019  . Shortness of breath 09/21/2019  . Hematuria 04/09/2019  . Chronic nonintractable headache 04/09/2019  . Fecal occult blood test positive 03/12/2019  . Suspected sleep apnea 12/05/2017  . Snoring 08/24/2017  . IFG (impaired fasting glucose) 09/28/2016  . Mixed hyperlipidemia 09/28/2016  . Dystrophic nail 09/28/2016  . Acute midline low back pain without sciatica 08/22/2016  . Spondylosis of cervical region without myelopathy or radiculopathy 08/22/2016  . Thyroid nodule 07/27/2016  . Chronic pain syndrome 02/20/2016  . Anxiety 11/16/2015  . Benign essential hypertension 09/14/2015  . Iron deficiency anemia due to chronic blood loss 06/16/2015  . Trigeminal neuralgia of left side of face 06/16/2015  . Allergic contact dermatitis 05/28/2015  . Hematoma - postoperative 05/16/2011  . Mass of shoulder, L scapular area  04/17/2011    Marcelline Mates, PT, DPT 10/19/2020, 2:11 PM  [facility] [address]  [address] [place], [place], [address] Phone: [phone]   Fax:  [phone]  Name: Kataleah Bejar MRN: [id number] Date of Birth: 08/31/1961

## 2020-10-21 ENCOUNTER — Ambulatory Visit: Payer: 59

## 2020-10-26 ENCOUNTER — Other Ambulatory Visit: Payer: Self-pay

## 2020-10-26 ENCOUNTER — Ambulatory Visit: Payer: 59 | Attending: Family Medicine | Admitting: Physical Therapy

## 2020-10-26 ENCOUNTER — Encounter: Payer: Self-pay | Admitting: Physical Therapy

## 2020-10-26 DIAGNOSIS — G44311 Acute post-traumatic headache, intractable: Secondary | ICD-10-CM | POA: Diagnosis present

## 2020-10-26 DIAGNOSIS — M5441 Lumbago with sciatica, right side: Secondary | ICD-10-CM | POA: Insufficient documentation

## 2020-10-26 DIAGNOSIS — M5442 Lumbago with sciatica, left side: Secondary | ICD-10-CM | POA: Diagnosis not present

## 2020-10-26 DIAGNOSIS — R293 Abnormal posture: Secondary | ICD-10-CM | POA: Insufficient documentation

## 2020-10-26 DIAGNOSIS — M542 Cervicalgia: Secondary | ICD-10-CM | POA: Diagnosis present

## 2020-10-26 DIAGNOSIS — M25512 Pain in left shoulder: Secondary | ICD-10-CM | POA: Diagnosis present

## 2020-10-26 DIAGNOSIS — M25511 Pain in right shoulder: Secondary | ICD-10-CM | POA: Diagnosis present

## 2020-10-26 DIAGNOSIS — M546 Pain in thoracic spine: Secondary | ICD-10-CM | POA: Insufficient documentation

## 2020-10-26 NOTE — Therapy (Signed)
[facility] [address]  [place], [place], [address] Phone: [phone]   Fax:  [phone]  Physical Therapy Treatment  Patient Details  Name: Shirley Hubbard MRN: [id number] Date of Birth: 06-18-1961 Referring Provider (PT): Eunice Blase, MD   Encounter Date: 10/26/2020   PT End of Session - 10/26/20 1144    Visit Number 7    Number of Visits 17    Date for PT Re-Evaluation 11/24/20    Authorization Type Aetna    PT Start Time 1102    PT Stop Time 1143    PT Time Calculation (min) 41 min    Activity Tolerance Patient tolerated treatment well;Patient limited by pain    Behavior During Therapy [facility] for tasks assessed/performed           Past Medical History:  Diagnosis Date  . Hyperlipidemia   . Hypertension    on medication but cant remember the name  . Lipoma   . Seroma    axillary    Past Surgical History:  Procedure Laterality Date  . ABDOMINAL HYSTERECTOMY  2000  . LIPOMA EXCISION     back  . seroma aspiration      There were no vitals filed for this visit.   Subjective Assessment - 10/26/20 1103    Subjective Somewhat feeling better.    Pertinent History HTN, HLD    Diagnostic tests 09/10/20 chest xray: negative; 09/10/20 head CT: negative    Patient Stated Goals decrease pain    Currently in Pain? Yes    Pain Score 6     Pain Location Shoulder    Pain Orientation Right;Left    Pain Descriptors / Indicators Aching    Pain Type Acute pain    Pain Radiating Towards down to R hand    Pain Score 6    Pain Location Groin    Pain Orientation Right    Pain Descriptors / Indicators --   "catching"   Pain Type Acute pain    Pain Score 4    Pain Location Head    Pain Orientation Right;Left    Pain Descriptors / Indicators Aching    Pain Type Acute pain                             [facility] Adult PT Treatment/Exercise - 10/26/20 0001      Lumbar Exercises:  Stretches   Piriformis Stretch Right;2 reps;30 seconds    Piriformis Stretch Limitations sitting KTOS    Figure 4 Stretch 2 reps;30 seconds;With overpressure    Figure 4 Stretch Limitations sitting    Other Lumbar Stretch Exercise R mod thomas with strap 3x30"      Lumbar Exercises: Aerobic   Nustep L4x6 min (UEs/LEs)      Lumbar Exercises: Machines for Strengthening   Other Lumbar Machine Exercise cybex row 2x10 15#   good form     Lumbar Exercises: Standing   Functional Squats 10 reps    Functional Squats Limitations mini squat at counter    Other Standing Lumbar Exercises sidestepping with yellow loop around ankles 2x length of counter   cues to maintain soft knees and upright trunk   Other Standing Lumbar Exercises alt toe tap on 9" step with 2# 3x30"   2nd set 22 reps, 3rd set 28 reps  PT Education - 10/26/20 1143    Education Details update to HEP    Person(s) Educated Patient    Methods Explanation;Demonstration;Tactile cues;Verbal cues;Handout    Comprehension Returned demonstration;Verbalized understanding            PT Short Term Goals - 10/05/20 1328      PT SHORT TERM GOAL #1   Title Patient to be independent with initial HEP.    Time 3    Period Weeks    Status Achieved    Target Date 10/20/20             PT Long Term Goals - 10/05/20 1328      PT LONG TERM GOAL #1   Title Patient to be independent with advanced HEP.    Time 8    Period Weeks    Status On-going      PT LONG TERM GOAL #2   Title Patient to demonstrate cervical and lumbar AROM WFL and with mild pain remaining.    Time 8    Period Weeks    Status On-going      PT LONG TERM GOAL #3   Title Patient to demonstrate B shoulder AROM WFL and without pain limiting.    Time 8    Period Weeks    Status On-going      PT LONG TERM GOAL #4   Title Patient to demonstrate B UE and LE strength >/=4+/5.    Time 6    Period Weeks    Status On-going      PT LONG  TERM GOAL #5   Title Patient to report tolerance of 2 hours on her feet without pain limiting.    Time 8    Period Weeks    Status On-going      PT LONG TERM GOAL #6   Title Patient to demonstrate good body mechanics and no pain when lifting 20lbs box from floor to improve tolerance for liting at work.    Time 8    Period Weeks    Status On-going                 Plan - 10/26/20 1144    Clinical Impression Statement Patient reporting some improvement since starting therapy, however still with c/o pain in multiple areas. Worked on gentle R hip stretching d/t c/o "catching" with walking. Patient tolerated hip flexor stretching with good tolerance, but with moderate tightness demonstrated. Initiated hip flexor strengthening with patient requiring cues to increase pace and demonstrating fatigue quickly. Proceeded with hip strengthening with good form and tolerance. Patient tolerated session well and without complaints. Demonstrating improvement in ther-ex tolerance.    Comorbidities HTN, HLD    Rehab Potential Good    PT Frequency 2x / week    PT Duration 8 weeks    PT Treatment/Interventions ADLs/Self Care Home Management;Cryotherapy;Electrical Stimulation;Iontophoresis 4mg /ml Dexamethasone;Moist Heat;Traction;Balance training;Therapeutic exercise;Therapeutic activities;Functional mobility training;Stair training;Gait training;Ultrasound;Neuromuscular re-education;Patient/family education;Canalith Repostioning;Manual techniques;Vestibular;Vasopneumatic Device;Taping;Energy conservation;Dry needling;Passive range of motion    PT Next Visit Plan STM/suboccipital release for HA relief; Progress cervicothoracolumbar ROM, and B UE/LE strengthening to tolerance, lumbopelvic rhythm    Consulted and Agree with Plan of Care Patient           Patient will benefit from skilled therapeutic intervention in order to improve the following deficits and impairments:  Hypomobility,Decreased  endurance,Decreased activity tolerance,Decreased strength,Increased fascial restricitons,Pain,Impaired UE functional use,Decreased balance,Decreased mobility,Difficulty walking,Increased muscle spasms,Improper body mechanics,Impaired perceived functional ability,Decreased range of motion,Postural dysfunction,Impaired flexibility  Visit Diagnosis: Acute bilateral low back pain with bilateral sciatica  Pain in thoracic spine  Cervicalgia  Intractable acute post-traumatic headache  Acute pain of left shoulder  Acute pain of right shoulder  Abnormal posture     Problem List Patient Active Problem List   Diagnosis Date Noted  . Moderate persistent asthma without complication 02/16/2020  . Impaired cognition 02/16/2020  . Restrictive lung disease 10/13/2019  . Chronic cough 10/13/2019  . Carotid artery stenosis 09/22/2019  . OSA (obstructive sleep apnea) 09/21/2019  . History of 2019 novel coronavirus disease (COVID-19) 09/21/2019  . Shortness of breath 09/21/2019  . Hematuria 04/09/2019  . Chronic nonintractable headache 04/09/2019  . Fecal occult blood test positive 03/12/2019  . Suspected sleep apnea 12/05/2017  . Snoring 08/24/2017  . IFG (impaired fasting glucose) 09/28/2016  . Mixed hyperlipidemia 09/28/2016  . Dystrophic nail 09/28/2016  . Acute midline low back pain without sciatica 08/22/2016  . Spondylosis of cervical region without myelopathy or radiculopathy 08/22/2016  . Thyroid nodule 07/27/2016  . Chronic pain syndrome 02/20/2016  . Anxiety 11/16/2015  . Benign essential hypertension 09/14/2015  . Iron deficiency anemia due to chronic blood loss 06/16/2015  . Trigeminal neuralgia of left side of face 06/16/2015  . Allergic contact dermatitis 05/28/2015  . Hematoma - postoperative 05/16/2011  . Mass of shoulder, L scapular area  04/17/2011     Anette Guarneri, PT, DPT 10/26/20 11:46 AM   [facility] [address]  [address] [place], [place], [address] Phone: [phone]   Fax:  [phone]  Name: Tashanna Dolin MRN: [id number] Date of Birth: 06/09/1961

## 2020-10-27 ENCOUNTER — Telehealth: Payer: Self-pay | Admitting: Family Medicine

## 2020-10-27 NOTE — Telephone Encounter (Signed)
[employer] forms received. Sent to [facility] 

## 2020-10-28 ENCOUNTER — Ambulatory Visit: Payer: 59 | Admitting: Physical Therapy

## 2020-11-02 ENCOUNTER — Other Ambulatory Visit: Payer: Self-pay

## 2020-11-02 ENCOUNTER — Ambulatory Visit: Payer: 59

## 2020-11-02 DIAGNOSIS — M542 Cervicalgia: Secondary | ICD-10-CM

## 2020-11-02 DIAGNOSIS — R293 Abnormal posture: Secondary | ICD-10-CM

## 2020-11-02 DIAGNOSIS — M546 Pain in thoracic spine: Secondary | ICD-10-CM

## 2020-11-02 DIAGNOSIS — M5441 Lumbago with sciatica, right side: Secondary | ICD-10-CM

## 2020-11-02 DIAGNOSIS — M25511 Pain in right shoulder: Secondary | ICD-10-CM

## 2020-11-02 DIAGNOSIS — G44311 Acute post-traumatic headache, intractable: Secondary | ICD-10-CM

## 2020-11-02 DIAGNOSIS — M5442 Lumbago with sciatica, left side: Secondary | ICD-10-CM

## 2020-11-02 DIAGNOSIS — M25512 Pain in left shoulder: Secondary | ICD-10-CM

## 2020-11-02 NOTE — Therapy (Signed)
[facility] [address]  [place], [place], [address] Phone: [phone]   Fax:  [phone]  Physical Therapy Treatment  Patient Details  Name: Shirley Hubbard MRN: [id number] Date of Birth: 10/29/1960 Referring Provider (PT): Eunice Blase, MD   Encounter Date: 11/02/2020   PT End of Session - 11/02/20 1110    Visit Number 8    Number of Visits 17    Date for PT Re-Evaluation 11/24/20    Authorization Type [employer]    PT Start Time 1105    PT Stop Time 1154   Ended visit with 10 min moist heat   PT Time Calculation (min) 49 min    Activity Tolerance Patient tolerated treatment well;Patient limited by pain    Behavior During Therapy [facility] for tasks assessed/performed           Past Medical History:  Diagnosis Date  . Hyperlipidemia   . Hypertension    on medication but cant remember the name  . Lipoma   . Seroma    axillary    Past Surgical History:  Procedure Laterality Date  . ABDOMINAL HYSTERECTOMY  2000  . LIPOMA EXCISION     back  . seroma aspiration      There were no vitals filed for this visit.   Subjective Assessment - 11/02/20 1108    Subjective Pt. noting R-sided headache giving her most pain today.    Pertinent History HTN, HLD    Diagnostic tests 09/10/20 chest xray: negative; 09/10/20 head CT: negative    Patient Stated Goals decrease pain    Currently in Pain? Yes    Pain Score 5     Pain Location Head    Pain Orientation Right;Lateral;Posterior;Anterior    Pain Descriptors / Indicators Aching    Pain Type Acute pain    Pain Onset More than a month ago    Pain Frequency Intermittent    Aggravating Factors  mornings, light bothers her    Pain Score 5    Pain Location Scapula    Pain Orientation Right;Left    Pain Descriptors / Indicators Sharp;Dull   Dull most of the time   Pain Type Acute pain    Pain Onset More than a month ago    Pain Frequency Intermittent     Aggravating Factors  unsure                             [facility] Adult PT Treatment/Exercise - 11/02/20 0001      Neck Exercises: Theraband   Shoulder External Rotation 10 reps    Shoulder External Rotation Limitations Hooklying yellow TB    Horizontal ABduction 15 reps    Horizontal ABduction Limitations yellow TB in hooklying on table      Neck Exercises: Seated   Other Seated Exercise seated chin tuck 3" x 10      Lumbar Exercises: Stretches   Lower Trunk Rotation Limitations 5" x 10 reps      Lumbar Exercises: Aerobic   Nustep L4x6 min (UEs/LEs)      Shoulder Exercises: Supine   Flexion Right;AAROM;10 reps    Flexion Limitations cane      Moist Heat Therapy   Number Minutes Moist Heat 10 Minutes    Moist Heat Location Lumbar Spine;Cervical   lumbar, thoracic, cervical spine     Neck Exercises: Stretches   Upper Trapezius Stretch Right;Left;2 reps;30  seconds    Upper Trapezius Stretch Limitations R only    Levator Stretch Right;2 reps;30 seconds    Levator Stretch Limitations R only    Other Neck Stretches B rhomboids stretch 2 x 30 sec                    PT Short Term Goals - 10/05/20 1328      PT SHORT TERM GOAL #1   Title Patient to be independent with initial HEP.    Time 3    Period Weeks    Status Achieved    Target Date 10/20/20             PT Long Term Goals - 10/05/20 1328      PT LONG TERM GOAL #1   Title Patient to be independent with advanced HEP.    Time 8    Period Weeks    Status On-going      PT LONG TERM GOAL #2   Title Patient to demonstrate cervical and lumbar AROM WFL and with mild pain remaining.    Time 8    Period Weeks    Status On-going      PT LONG TERM GOAL #3   Title Patient to demonstrate B shoulder AROM WFL and without pain limiting.    Time 8    Period Weeks    Status On-going      PT LONG TERM GOAL #4   Title Patient to demonstrate B UE and LE strength >/=4+/5.    Time 6    Period  Weeks    Status On-going      PT LONG TERM GOAL #5   Title Patient to report tolerance of 2 hours on her feet without pain limiting.    Time 8    Period Weeks    Status On-going      PT LONG TERM GOAL #6   Title Patient to demonstrate good body mechanics and no pain when lifting 20lbs box from floor to improve tolerance for liting at work.    Time 8    Period Weeks    Status On-going                 Plan - 11/02/20 1110    Clinical Impression Statement Pt. reporting R-sided headache is primary pain concern today.  Session focused on gentle cervical/upper shoulder ROM and stretching to reduce tension and pain.  Progressed shoulder supine AAROM and LTR for lumbar rotation with some increased pain reported relieved with rest.  Pt. reports consistent HEP performance and tolerated additional activities in session well today.  Continues to have slow antalgic movement pattern in session and reports difficulty sleeping secondary to pain.  Ended with moist heat to lumbar/thoracic/cervical spine per pt. request to end session.    Comorbidities HTN, HLD    Rehab Potential Good    PT Frequency 2x / week    PT Duration 8 weeks    PT Treatment/Interventions ADLs/Self Care Home Management;Cryotherapy;Electrical Stimulation;Iontophoresis 4mg /ml Dexamethasone;Moist Heat;Traction;Balance training;Therapeutic exercise;Therapeutic activities;Functional mobility training;Stair training;Gait training;Ultrasound;Neuromuscular re-education;Patient/family education;Canalith Repostioning;Manual techniques;Vestibular;Vasopneumatic Device;Taping;Energy conservation;Dry needling;Passive range of motion    PT Next Visit Plan STM/suboccipital release for HA relief; Progress cervicothoracolumbar ROM, and B UE/LE strengthening to tolerance, lumbopelvic rhythm    Consulted and Agree with Plan of Care Patient           Patient will benefit from skilled therapeutic intervention in order to improve the following  deficits and impairments:  Hypomobility,Decreased endurance,Decreased activity tolerance,Decreased strength,Increased fascial restricitons,Pain,Impaired UE functional use,Decreased balance,Decreased mobility,Difficulty walking,Increased muscle spasms,Improper body mechanics,Impaired perceived functional ability,Decreased range of motion,Postural dysfunction,Impaired flexibility  Visit Diagnosis: Acute bilateral low back pain with bilateral sciatica  Pain in thoracic spine  Cervicalgia  Intractable acute post-traumatic headache  Acute pain of left shoulder  Acute pain of right shoulder  Abnormal posture     Problem List Patient Active Problem List   Diagnosis Date Noted  . Moderate persistent asthma without complication 76/28/3151  . Impaired cognition 02/16/2020  . Restrictive lung disease 10/13/2019  . Chronic cough 10/13/2019  . Carotid artery stenosis 09/22/2019  . OSA (obstructive sleep apnea) 09/21/2019  . History of 2019 novel coronavirus disease (COVID-19) 09/21/2019  . Shortness of breath 09/21/2019  . Hematuria 04/09/2019  . Chronic nonintractable headache 04/09/2019  . Fecal occult blood test positive 03/12/2019  . Suspected sleep apnea 12/05/2017  . Snoring 08/24/2017  . IFG (impaired fasting glucose) 09/28/2016  . Mixed hyperlipidemia 09/28/2016  . Dystrophic nail 09/28/2016  . Acute midline low back pain without sciatica 08/22/2016  . Spondylosis of cervical region without myelopathy or radiculopathy 08/22/2016  . Thyroid nodule 07/27/2016  . Chronic pain syndrome 02/20/2016  . Anxiety 11/16/2015  . Benign essential hypertension 09/14/2015  . Iron deficiency anemia due to chronic blood loss 06/16/2015  . Trigeminal neuralgia of left side of face 06/16/2015  . Allergic contact dermatitis 05/28/2015  . Hematoma - postoperative 05/16/2011  . Mass of shoulder, L scapular area  04/17/2011    Bess Harvest, PTA 11/02/20 11:49 AM   [facility] [address]  [place], [place], [address] Phone: [phone]   Fax:  [phone]  Name: Shirley Hubbard MRN: [id number] Date of Birth: [birth year]

## 2020-11-04 ENCOUNTER — Other Ambulatory Visit: Payer: Self-pay

## 2020-11-04 ENCOUNTER — Ambulatory Visit: Payer: 59 | Admitting: Physical Therapy

## 2020-11-04 ENCOUNTER — Encounter: Payer: Self-pay | Admitting: Physical Therapy

## 2020-11-04 DIAGNOSIS — M542 Cervicalgia: Secondary | ICD-10-CM

## 2020-11-04 DIAGNOSIS — R293 Abnormal posture: Secondary | ICD-10-CM

## 2020-11-04 DIAGNOSIS — M5442 Lumbago with sciatica, left side: Secondary | ICD-10-CM | POA: Diagnosis not present

## 2020-11-04 DIAGNOSIS — G44311 Acute post-traumatic headache, intractable: Secondary | ICD-10-CM

## 2020-11-04 DIAGNOSIS — M546 Pain in thoracic spine: Secondary | ICD-10-CM

## 2020-11-04 DIAGNOSIS — M25511 Pain in right shoulder: Secondary | ICD-10-CM

## 2020-11-04 DIAGNOSIS — M25512 Pain in left shoulder: Secondary | ICD-10-CM

## 2020-11-04 NOTE — Therapy (Signed)
[facility] [address]  [place], [place], [address] Phone: [phone]   Fax:  [phone]  Physical Therapy Treatment  Patient Details  Name: Shirley Hubbard MRN: [id number] Date of Birth: August 25, 1961 Referring Provider (PT): Eunice Blase, MD   Encounter Date: 11/04/2020   PT End of Session - 11/04/20 1152    Visit Number 9    Number of Visits 17    Date for PT Re-Evaluation 11/24/20    Authorization Type [employer]et[employer]    PT Start Time 1103    PT Stop Time 1159    PT Time Calculation (min) 56 min    Activity Tolerance Patient tolerated treatment well    Behavior During Therapy [facility] for tasks assessed/performed           Past Medical History:  Diagnosis Date  . Hyperlipidemia   . Hypertension    on medication but cant remember the name  . Lipoma   . Seroma    axillary    Past Surgical History:  Procedure Laterality Date  . ABDOMINAL HYSTERECTOMY  2000  . LIPOMA EXCISION     back  . seroma aspiration      There were no vitals filed for this visit.   Subjective Assessment - 11/04/20 1104    Subjective Doing okay. Notes that her HA has come back and is feeling it in her shoulder blades radiating up her head.    Pertinent History HTN, HLD    Diagnostic tests 09/10/20 chest xray: negative; 09/10/20 head CT: negative    Patient Stated Goals decrease pain    Currently in Pain? Yes    Pain Score 5    5.5   Pain Location Head    Pain Orientation Right;Left    Pain Descriptors / Indicators Aching    Pain Type Acute pain    Pain Radiating Towards radiating from B shoulder blades up back of neck and up to B temples                             [facility] Adult PT Treatment/Exercise - 11/04/20 0001      Self-Care   Self-Care Other Self-Care Comments    Other Self-Care Comments  edu and practice using ball on wall self-STM to B suboccipitals and rhomboids      Neck Exercises: Seated    Neck Retraction 10 reps    Neck Retraction Limitations 10x with yellow TB, 10x without TB   difficulty maintaining chin tuck, thus proceeded without resistance     Lumbar Exercises: Aerobic   Nustep L4x6 min (UEs/LEs)      Lumbar Exercises: Quadruped   Madcat/Old Horse 10 reps    Madcat/Old Horse Limitations cues for rhythmic breathing and proper movement pattern      Moist Heat Therapy   Number Minutes Moist Heat 10 Minutes    Moist Heat Location Cervical      Manual Therapy   Manual Therapy Soft tissue mobilization    Soft tissue mobilization STM to B UT, LS, cervical paraspinals, rhomboids   trigger points most evident in B UT and LS, R>L   Myofascial Release B suboccipital release      Neck Exercises: Stretches   Neck Stretch 2 reps;30 seconds   suboccipital stretch                   PT Short Term Goals - 10/05/20  [place] #1   Title Patient to be independent with initial HEP.    Time 3    Period Weeks    Status Achieved    Target Date 10/20/20             PT Long Term Goals - 10/05/20 1328      PT LONG TERM GOAL #1   Title Patient to be independent with advanced HEP.    Time 8    Period Weeks    Status On-going      PT LONG TERM GOAL #2   Title Patient to demonstrate cervical and lumbar AROM WFL and with mild pain remaining.    Time 8    Period Weeks    Status On-going      PT LONG TERM GOAL #3   Title Patient to demonstrate B shoulder AROM WFL and without pain limiting.    Time 8    Period Weeks    Status On-going      PT LONG TERM GOAL #4   Title Patient to demonstrate B UE and LE strength >/=4+/5.    Time 6    Period Weeks    Status On-going      PT LONG TERM GOAL #5   Title Patient to report tolerance of 2 hours on her feet without pain limiting.    Time 8    Period Weeks    Status On-going      PT LONG TERM GOAL #6   Title Patient to demonstrate good body mechanics and no pain when lifting 20lbs box from  floor to improve tolerance for liting at work.    Time 8    Period Weeks    Status On-going                 Plan - 11/04/20 1152    Clinical Impression Statement Patient reporting HA stemming from B scapulae to posterior neck and B temples. Attempted to increase resistance with cervical retractions, however patient demonstrated difficulty maintaining chin tuck, thus proceeded without resistance. Educated patient on use of self-STM to address muscle tension and pain at home. Patient with slow and limited ROM with gentle lumbopelvic mobility work today. Remainder of session focused on MT to address muscle tension for hopeful relief of HA. Patient with tenderness throughout B cervical and shoulder musculature, with trigger points most evident in B UT and LS, R>L. Patient reported good improvement in severity of HA after MT. Ended session with moist heat to neck. No complaints at end of session.    Comorbidities HTN, HLD    Rehab Potential Good    PT Frequency 2x / week    PT Duration 8 weeks    PT Treatment/Interventions ADLs/Self Care Home Management;Cryotherapy;Electrical Stimulation;Iontophoresis 4mg /ml Dexamethasone;Moist Heat;Traction;Balance training;Therapeutic exercise;Therapeutic activities;Functional mobility training;Stair training;Gait training;Ultrasound;Neuromuscular re-education;Patient/family education;Canalith Repostioning;Manual techniques;Vestibular;Vasopneumatic Device;Taping;Energy conservation;Dry needling;Passive range of motion    PT Next Visit Plan STM/suboccipital release for HA relief; Progress cervicothoracolumbar ROM, and B UE/LE strengthening to tolerance, lumbopelvic rhythm    Consulted and Agree with Plan of Care Patient           Patient will benefit from skilled therapeutic intervention in order to improve the following deficits and impairments:  Hypomobility,Decreased endurance,Decreased activity tolerance,Decreased strength,Increased fascial  restricitons,Pain,Impaired UE functional use,Decreased balance,Decreased mobility,Difficulty walking,Increased muscle spasms,Improper body mechanics,Impaired perceived functional ability,Decreased range of motion,Postural dysfunction,Impaired flexibility  Visit Diagnosis: Acute bilateral low back pain with bilateral  sciatica  Pain in thoracic spine  Cervicalgia  Intractable acute post-traumatic headache  Acute pain of left shoulder  Acute pain of right shoulder  Abnormal posture     Problem List Patient Active Problem List   Diagnosis Date Noted  . Moderate persistent asthma without complication 97/98/9211  . Impaired cognition 02/16/2020  . Restrictive lung disease 10/13/2019  . Chronic cough 10/13/2019  . Carotid artery stenosis 09/22/2019  . OSA (obstructive sleep apnea) 09/21/2019  . History of 2019 novel coronavirus disease (COVID-19) 09/21/2019  . Shortness of breath 09/21/2019  . Hematuria 04/09/2019  . Chronic nonintractable headache 04/09/2019  . Fecal occult blood test positive 03/12/2019  . Suspected sleep apnea 12/05/2017  . Snoring 08/24/2017  . IFG (impaired fasting glucose) 09/28/2016  . Mixed hyperlipidemia 09/28/2016  . Dystrophic nail 09/28/2016  . Acute midline low back pain without sciatica 08/22/2016  . Spondylosis of cervical region without myelopathy or radiculopathy 08/22/2016  . Thyroid nodule 07/27/2016  . Chronic pain syndrome 02/20/2016  . Anxiety 11/16/2015  . Benign essential hypertension 09/14/2015  . Iron deficiency anemia due to chronic blood loss 06/16/2015  . Trigeminal neuralgia of left side of face 06/16/2015  . Allergic contact dermatitis 05/28/2015  . Hematoma - postoperative 05/16/2011  . Mass of shoulder, L scapular area  04/17/2011     Janene Harvey, PT, DPT 11/04/20 12:06 PM   [facility] [address]  [place], [place], [address] Phone:  [phone]   Fax:  [phone]  Name: Shirley Hubbard MRN: [id number] Date of Birth: 1960/10/29

## 2020-11-07 ENCOUNTER — Other Ambulatory Visit: Payer: Self-pay | Admitting: Family Medicine

## 2020-11-11 ENCOUNTER — Ambulatory Visit: Payer: 59 | Admitting: Physical Therapy

## 2020-11-11 ENCOUNTER — Encounter: Payer: Self-pay | Admitting: Physical Therapy

## 2020-11-11 ENCOUNTER — Other Ambulatory Visit: Payer: Self-pay

## 2020-11-11 DIAGNOSIS — M25511 Pain in right shoulder: Secondary | ICD-10-CM

## 2020-11-11 DIAGNOSIS — M542 Cervicalgia: Secondary | ICD-10-CM

## 2020-11-11 DIAGNOSIS — R293 Abnormal posture: Secondary | ICD-10-CM

## 2020-11-11 DIAGNOSIS — M546 Pain in thoracic spine: Secondary | ICD-10-CM

## 2020-11-11 DIAGNOSIS — M5441 Lumbago with sciatica, right side: Secondary | ICD-10-CM

## 2020-11-11 DIAGNOSIS — G44311 Acute post-traumatic headache, intractable: Secondary | ICD-10-CM

## 2020-11-11 DIAGNOSIS — M25512 Pain in left shoulder: Secondary | ICD-10-CM

## 2020-11-11 DIAGNOSIS — M5442 Lumbago with sciatica, left side: Secondary | ICD-10-CM | POA: Diagnosis not present
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
## 2020-11-16 ENCOUNTER — Other Ambulatory Visit: Payer: Self-pay

## 2020-11-16 ENCOUNTER — Ambulatory Visit: Payer: 59

## 2020-11-16 DIAGNOSIS — M25512 Pain in left shoulder: Secondary | ICD-10-CM

## 2020-11-16 DIAGNOSIS — M542 Cervicalgia: Secondary | ICD-10-CM

## 2020-11-16 DIAGNOSIS — R293 Abnormal posture: Secondary | ICD-10-CM

## 2020-11-16 DIAGNOSIS — M5441 Lumbago with sciatica, right side: Secondary | ICD-10-CM

## 2020-11-16 DIAGNOSIS — G44311 Acute post-traumatic headache, intractable: Secondary | ICD-10-CM

## 2020-11-16 DIAGNOSIS — M25511 Pain in right shoulder: Secondary | ICD-10-CM

## 2020-11-16 DIAGNOSIS — M546 Pain in thoracic spine: Secondary | ICD-10-CM

## 2020-11-16 DIAGNOSIS — M5442 Lumbago with sciatica, left side: Secondary | ICD-10-CM | POA: Diagnosis not present

## 2020-11-16 NOTE — Therapy (Signed)
[facility] [address]  [place], [place], [address] Phone: [phone]   Fax:  [phone]  Physical Therapy Treatment  Patient Details  Name: Shirley Hubbard MRN: [id number] Date of Birth: 1961/05/10 Referring Provider (PT): Eunice Blase, MD   Encounter Date: 11/16/2020   PT End of Session - 11/16/20 1109    Visit Number 11    Number of Visits 28    Date for PT Re-Evaluation 01/06/21    Authorization Type [employer]et[employer]    PT Start Time 1105    PT Stop Time 1158   15 min   PT Time Calculation (min) 53 min    Activity Tolerance Patient tolerated treatment well;Patient limited by pain    Behavior During Therapy [facility] for tasks assessed/performed           Past Medical History:  Diagnosis Date  . Hyperlipidemia   . Hypertension    on medication but cant remember the name  . Lipoma   . Seroma    axillary    Past Surgical History:  Procedure Laterality Date  . ABDOMINAL HYSTERECTOMY  2000  . LIPOMA EXCISION     back  . seroma aspiration      There were no vitals filed for this visit.   Subjective Assessment - 11/16/20 1113    Subjective Noting worst pain today in B upper shoulders and neck.    Pertinent History HTN, HLD    Diagnostic tests 09/10/20 chest xray: negative; 09/10/20 head CT: negative    Patient Stated Goals decrease pain    Currently in Pain? Yes    Pain Score 4     Pain Location Shoulder    Pain Orientation Right;Left    Pain Descriptors / Indicators Aching;Dull    Pain Type Acute pain    Pain Radiating Towards Stabbing pain into B shoulder blades    Pain Frequency Intermittent    Multiple Pain Sites No                             [facility] Adult PT Treatment/Exercise - 11/16/20 0001      Neck Exercises: Theraband   Shoulder External Rotation 10 reps;Red    Shoulder External Rotation Limitations Hooklying red TB    Horizontal ABduction 10 reps;Red   cues for  scapular retraction   Horizontal ABduction Limitations red TB in hooklying      Lumbar Exercises: Stretches   Lower Trunk Rotation Limitations 5" x 10      Lumbar Exercises: Seated   Other Seated Lumbar Exercises Seated lumbar rotation stretch 5" x 10    Other Seated Lumbar Exercises Seated thoracic extension 5" x 10      Shoulder Exercises: Pulleys   Flexion 3 minutes    Scaption 3 minutes      Moist Heat Therapy   Number Minutes Moist Heat 15 Minutes    Moist Heat Location Cervical   and upper back     Electrical Stimulation   Electrical Stimulation Location B rhomboids    Electrical Stimulation Action IFC    Electrical Stimulation Parameters 80-150Hz , intensity to pt. tolerance, 15'    Electrical Stimulation Goals Tone;Pain      Neck Exercises: Stretches   Upper Trapezius Stretch Right;Left;2 reps;20 seconds    Levator Stretch Limitations B    Other Neck Stretches B rhomboids stretch 2 x 30 sec  PT Short Term Goals - 10/05/20 1328      PT SHORT TERM GOAL #1   Title Patient to be independent with initial HEP.    Time 3    Period Weeks    Status Achieved    Target Date 10/20/20             PT Long Term Goals - 11/11/20 1136      PT LONG TERM GOAL #1   Title Patient to be independent with advanced HEP.    Time 8    Period Weeks    Status Partially Met   met for current   Target Date 01/06/21      PT LONG TERM GOAL #2   Title Patient to demonstrate cervical and lumbar AROM WFL and with mild pain remaining.    Time 8    Period Weeks    Status Partially Met   improved- see objective measures   Target Date 01/06/21      PT LONG TERM GOAL #3   Title Patient to demonstrate B shoulder AROM WFL and without pain limiting.    Time 8    Period Weeks    Status Partially Met   improved- see objective measures   Target Date 01/06/21      PT LONG TERM GOAL #4   Title Patient to demonstrate B UE and LE strength >/=4+/5.    Time 8     Period Weeks    Status Partially Met   improved- see objective measures   Target Date 01/06/21      PT LONG TERM GOAL #5   Title Patient to report tolerance of 2 hours on her feet without pain limiting.    Time 8    Period Weeks    Status On-going   reports 45 min   Target Date 01/06/21      PT LONG TERM GOAL #6   Title Patient to demonstrate good body mechanics and no pain when lifting 20lbs box from floor to improve tolerance for liting at work.    Time 8    Period Weeks    Status On-going   not tested d/t time constraints   Target Date 01/06/21                 Plan - 11/16/20 1120    Clinical Impression Statement Pt. reporting most pain today is B upper shoulder and neck.  Proceeded with gentle thoracolumbar ROM and cervical/upper shoulder stretching without issue.  Pt. does note some improvement in upper shoulder pain with stretching and moist heat/E-stim thus ended session with these modalities.    Comorbidities HTN, HLD    Rehab Potential Good    PT Frequency 2x / week    PT Duration 8 weeks    PT Treatment/Interventions ADLs/Self Care Home Management;Cryotherapy;Electrical Stimulation;Iontophoresis 4mg /ml Dexamethasone;Moist Heat;Traction;Balance training;Therapeutic exercise;Therapeutic activities;Functional mobility training;Stair training;Gait training;Ultrasound;Neuromuscular re-education;Patient/family education;Canalith Repostioning;Manual techniques;Vestibular;Vasopneumatic Device;Taping;Energy conservation;Dry needling;Passive range of motion    PT Next Visit Plan STM/suboccipital release for HA relief; Progress cervicothoracolumbar ROM, and B UE/LE strengthening to tolerance, lumbopelvic rhythm    Consulted and Agree with Plan of Care Patient           Patient will benefit from skilled therapeutic intervention in order to improve the following deficits and impairments:  Hypomobility,Decreased endurance,Decreased activity tolerance,Decreased strength,Increased  fascial restricitons,Pain,Impaired UE functional use,Decreased balance,Decreased mobility,Difficulty walking,Increased muscle spasms,Improper body mechanics,Impaired perceived functional ability,Decreased range of motion,Postural dysfunction,Impaired flexibility  Visit Diagnosis: Acute bilateral low  back pain with bilateral sciatica  Pain in thoracic spine  Cervicalgia  Intractable acute post-traumatic headache  Acute pain of left shoulder  Acute pain of right shoulder  Abnormal posture     Problem List Patient Active Problem List   Diagnosis Date Noted  . Moderate persistent asthma without complication 90/21/1155  . Impaired cognition 02/16/2020  . Restrictive lung disease 10/13/2019  . Chronic cough 10/13/2019  . Carotid artery stenosis 09/22/2019  . OSA (obstructive sleep apnea) 09/21/2019  . History of 2019 novel coronavirus disease (COVID-19) 09/21/2019  . Shortness of breath 09/21/2019  . Hematuria 04/09/2019  . Chronic nonintractable headache 04/09/2019  . Fecal occult blood test positive 03/12/2019  . Suspected sleep apnea 12/05/2017  . Snoring 08/24/2017  . IFG (impaired fasting glucose) 09/28/2016  . Mixed hyperlipidemia 09/28/2016  . Dystrophic nail 09/28/2016  . Acute midline low back pain without sciatica 08/22/2016  . Spondylosis of cervical region without myelopathy or radiculopathy 08/22/2016  . Thyroid nodule 07/27/2016  . Chronic pain syndrome 02/20/2016  . Anxiety 11/16/2015  . Benign essential hypertension 09/14/2015  . Iron deficiency anemia due to chronic blood loss 06/16/2015  . Trigeminal neuralgia of left side of face 06/16/2015  . Allergic contact dermatitis 05/28/2015  . Hematoma - postoperative 05/16/2011  . Mass of shoulder, L scapular area  04/17/2011    Bess Harvest, PTA 11/16/20 12:03 PM   [facility] [address]  [place], [place], [address] Phone: [phone]    Fax:  [phone]  Name: Jaquel Coomer MRN: [id number] Date of Birth: 07/30/1961

## 2020-11-18 ENCOUNTER — Encounter: Payer: Self-pay | Admitting: Physical Therapy

## 2020-11-18 ENCOUNTER — Other Ambulatory Visit: Payer: Self-pay

## 2020-11-18 ENCOUNTER — Ambulatory Visit: Payer: 59 | Admitting: Physical Therapy

## 2020-11-18 DIAGNOSIS — M5441 Lumbago with sciatica, right side: Secondary | ICD-10-CM

## 2020-11-18 DIAGNOSIS — M5442 Lumbago with sciatica, left side: Secondary | ICD-10-CM | POA: Diagnosis not present

## 2020-11-18 DIAGNOSIS — M25512 Pain in left shoulder: Secondary | ICD-10-CM

## 2020-11-18 DIAGNOSIS — R293 Abnormal posture: Secondary | ICD-10-CM

## 2020-11-18 DIAGNOSIS — M542 Cervicalgia: Secondary | ICD-10-CM

## 2020-11-18 DIAGNOSIS — M546 Pain in thoracic spine: Secondary | ICD-10-CM

## 2020-11-18 DIAGNOSIS — G44311 Acute post-traumatic headache, intractable: Secondary | ICD-10-CM

## 2020-11-18 DIAGNOSIS — M25511 Pain in right shoulder: Secondary | ICD-10-CM

## 2020-11-18 NOTE — Patient Instructions (Signed)

## 2020-11-18 NOTE — Therapy (Signed)
[facility] [address]  [place], [place], [address] Phone: [phone]   Fax:  [phone]  Physical Therapy Treatment  Patient Details  Name: Shirley Hubbard MRN: [id number] Date of Birth: 1960-11-15 Referring Provider (PT): Eunice Blase, MD   Encounter Date: 11/18/2020   PT End of Session - 11/18/20 1149    Visit Number 12    Number of Visits 28    Date for PT Re-Evaluation 01/06/21    Authorization Type Aet[employer]    PT Start Time 1105    PT Stop Time 1200    PT Time Calculation (min) 55 min    Activity Tolerance Patient tolerated treatment well;Patient limited by pain    Behavior During Therapy [facility] for tasks assessed/performed           Past Medical History:  Diagnosis Date  . Hyperlipidemia   . Hypertension    on medication but cant remember the name  . Lipoma   . Seroma    axillary    Past Surgical History:  Procedure Laterality Date  . ABDOMINAL HYSTERECTOMY  2000  . LIPOMA EXCISION     back  . seroma aspiration      There were no vitals filed for this visit.   Subjective Assessment - 11/18/20 1105    Subjective Doing okay- pain in the mid and LB.    Pertinent History HTN, HLD    Diagnostic tests 09/10/20 chest xray: negative; 09/10/20 head CT: negative    Patient Stated Goals decrease pain    Currently in Pain? Yes    Pain Score 5    Pain Location Back    Pain Orientation Mid;Lower    Pain Descriptors / Indicators Aching;Sharp    Pain Type Acute pain                             [facility] Adult PT Treatment/Exercise - 11/18/20 0001      Lumbar Exercises: Aerobic   Nustep L4x6 min (UEs/LEs)      Lumbar Exercises: Supine   Pelvic Tilt 1 rep;10 reps    Pelvic Tilt Limitations better ROM      Lumbar Exercises: Sidelying   Other Sidelying Lumbar Exercises R/L open book stretch with top LE elevated on bolster 5x each   very limited d/t shoulder pain- slightly  better with bent elbow     Lumbar Exercises: Quadruped   Madcat/Old Horse 10 reps    Madcat/Old Horse Limitations difficulty acheiving posterior pelvic tilt    Other Quadruped Lumbar Exercises child's pose 5x5"      Modalities   Modalities Moist Heat;Electrical Stimulation      Moist Heat Therapy   Number Minutes Moist Heat 15 Minutes    Moist Heat Location --   midback     Electrical Stimulation   Electrical Stimulation Location B rhomboids    Electrical Stimulation Action IFC    Electrical Stimulation Parameters 80-_0 ; intensity to tolerance; 15 min    Electrical Stimulation Goals Tone;Pain      Manual Therapy   Manual Therapy Soft tissue mobilization;Myofascial release    Manual therapy comments prone    Soft tissue mobilization STM to B UT, LS, cervical thoracic paraspinals, rhomboids    Myofascial Release manual TPR to B rhomboids   very TTP R>L     Neck Exercises: Stretches   Other Neck Stretches R/L rhomboid stretch in doorway 5x10"  PT Education - 11/18/20 1148    Education Details edu on personal TENS use, wear time, precautions    Person(s) Educated Patient    Methods Explanation;Handout    Comprehension Verbalized understanding            PT Short Term Goals - 10/05/20 1328      PT SHORT TERM GOAL #1   Title Patient to be independent with initial HEP.    Time 3    Period Weeks    Status Achieved    Target Date 10/20/20             PT Long Term Goals - 11/11/20 1136      PT LONG TERM GOAL #1   Title Patient to be independent with advanced HEP.    Time 8    Period Weeks    Status Partially Met   met for current   Target Date 01/06/21      PT LONG TERM GOAL #2   Title Patient to demonstrate cervical and lumbar AROM WFL and with mild pain remaining.    Time 8    Period Weeks    Status Partially Met   improved- see objective measures   Target Date 01/06/21      PT LONG TERM GOAL #3   Title Patient to demonstrate  B shoulder AROM WFL and without pain limiting.    Time 8    Period Weeks    Status Partially Met   improved- see objective measures   Target Date 01/06/21      PT LONG TERM GOAL #4   Title Patient to demonstrate B UE and LE strength >/=4+/5.    Time 8    Period Weeks    Status Partially Met   improved- see objective measures   Target Date 01/06/21      PT LONG TERM GOAL #5   Title Patient to report tolerance of 2 hours on her feet without pain limiting.    Time 8    Period Weeks    Status On-going   reports 45 min   Target Date 01/06/21      PT LONG TERM GOAL #6   Title Patient to demonstrate good body mechanics and no pain when lifting 20lbs box from floor to improve tolerance for liting at work.    Time 8    Period Weeks    Status On-going   not tested d/t time constraints   Target Date 01/06/21                 Plan - 11/18/20 1151    Clinical Impression Statement Patient arrived to session with report of mid and LBP as main priority today. Worked on thoracolumbar and lumbopelvic mobility work to patient's tolerance. Patient demonstrated difficulty achieving posterior pelvic tilt in quadruped- better understanding/ROM in supine. Proceeded with open book stretch which patient was limited by d/t shoulder/interscapular pain. Addressed pain with STM/TPR with patient reporting more tenderness R>L. Ended session with e-stim and moist heat to midback for pain relief. Patient reported good relief from this modality, thus educated her on personal TENS unit for pain management at home. Patient reported understanding and without complaints at end of session.    Comorbidities HTN, HLD    Rehab Potential Good    PT Frequency 2x / week    PT Duration 8 weeks    PT Treatment/Interventions ADLs/Self Care Home Management;Cryotherapy;Electrical Stimulation;Iontophoresis 10m/ml Dexamethasone;Moist Heat;Traction;Balance training;Therapeutic exercise;Therapeutic activities;Functional mobility  training;Stair training;Gait  training;Ultrasound;Neuromuscular re-education;Patient/family education;Canalith Repostioning;Manual techniques;Vestibular;Vasopneumatic Device;Taping;Energy conservation;Dry needling;Passive range of motion    PT Next Visit Plan STM/suboccipital release for HA relief; Progress cervicothoracolumbar ROM, and B UE/LE strengthening to tolerance, lumbopelvic rhythm    Consulted and Agree with Plan of Care Patient           Patient Shirley benefit from skilled therapeutic intervention in order to improve the following deficits and impairments:  Hypomobility,Decreased endurance,Decreased activity tolerance,Decreased strength,Increased fascial restricitons,Pain,Impaired UE functional use,Decreased balance,Decreased mobility,Difficulty walking,Increased muscle spasms,Improper body mechanics,Impaired perceived functional ability,Decreased range of motion,Postural dysfunction,Impaired flexibility  Visit Diagnosis: Acute bilateral low back pain with bilateral sciatica  Pain in thoracic spine  Cervicalgia  Intractable acute post-traumatic headache  Acute pain of left shoulder  Acute pain of right shoulder  Abnormal posture     Problem List Patient Active Problem List   Diagnosis Date Noted  . Moderate persistent asthma without complication 11/46/4314  . Impaired cognition 02/16/2020  . Restrictive lung disease 10/13/2019  . Chronic cough 10/13/2019  . Carotid artery stenosis 09/22/2019  . OSA (obstructive sleep apnea) 09/21/2019  . History of 2019 novel coronavirus disease (COVID-19) 09/21/2019  . Shortness of breath 09/21/2019  . Hematuria 04/09/2019  . Chronic nonintractable headache 04/09/2019  . Fecal occult blood test positive 03/12/2019  . Suspected sleep apnea 12/05/2017  . Snoring 08/24/2017  . IFG (impaired fasting glucose) 09/28/2016  . Mixed hyperlipidemia 09/28/2016  . Dystrophic nail 09/28/2016  . Acute midline low back pain without sciatica  08/22/2016  . Spondylosis of cervical region without myelopathy or radiculopathy 08/22/2016  . Thyroid nodule 07/27/2016  . Chronic pain syndrome 02/20/2016  . Anxiety 11/16/2015  . Benign essential hypertension 09/14/2015  . Iron deficiency anemia due to chronic blood loss 06/16/2015  . Trigeminal neuralgia of left side of face 06/16/2015  . Allergic contact dermatitis 05/28/2015  . Hematoma - postoperative 05/16/2011  . Mass of shoulder, L scapular area  04/17/2011     Janene Harvey, PT, DPT 11/18/20 12:06 PM   [facility] [address]  [place], [place], [address] Phone: [phone]   Fax:  [phone]  Name: Shirley Hubbard MRN: [id number] Date of Birth: 07/20/1961

## 2020-11-23 ENCOUNTER — Ambulatory Visit (INDEPENDENT_AMBULATORY_CARE_PROVIDER_SITE_OTHER): Payer: 59 | Admitting: Family Medicine

## 2020-11-23 ENCOUNTER — Encounter: Payer: Self-pay | Admitting: Family Medicine

## 2020-11-23 ENCOUNTER — Other Ambulatory Visit: Payer: Self-pay

## 2020-11-23 VITALS — BP 157/93 | HR 88

## 2020-11-23 DIAGNOSIS — R450 Nervousness: Secondary | ICD-10-CM

## 2020-11-23 DIAGNOSIS — R251 Tremor, unspecified: Secondary | ICD-10-CM | POA: Diagnosis not present

## 2020-11-23 MED ORDER — ALPRAZOLAM 0.25 MG PO TABS: ORAL_TABLET | ORAL | 0 refills | Status: AC

## 2020-11-23 MED ORDER — ALPRAZOLAM 0.25 MG PO TABS: ORAL_TABLET | ORAL | 0 refills | Status: DC

## 2020-11-23 NOTE — Addendum Note (Signed)
Addended by: Hortencia Pilar on: 11/23/2020 02:02 PM   Modules accepted: Orders

## 2020-11-23 NOTE — Addendum Note (Signed)
Addended by: Hortencia Pilar on: 11/23/2020 02:47 PM   Modules accepted: Orders

## 2020-11-23 NOTE — Progress Notes (Signed)
Office Visit Note   Patient: Shirley Hubbard           Date of Birth: [birth year]           MRN: [id number] Visit Date: 11/23/2020 Requested by: Eunice Blase, MD [address] [place],  [place] [address] PCP: Eunice Blase, MD  Subjective: Chief Complaint  Patient presents with  . Other    Shakiness and nervousness - woke up yesterday morning with this. Yesterday was the funeral service for a very close family member. Stomach is upset. Broke out in cold sweat. Burning sensation down the left leg.    HPI: She is here with anxiousness.  Unfortunately her older cousin died recently.  They were very close.  She went to his funeral in [place] yesterday.  All day she has felt shaky and anxious with some nausea.  Denies any chest pain or palpitations.  Denies any fevers, chills, congestion.  She has had Covid illness herself, and she also have 3 vaccinations afterward.  She was concerned that she might have Covid again so she came in for evaluation.  Again, she is not having any symptoms of illness.               ROS:   All other systems were reviewed and are negative.  Objective: Vital Signs: BP (!) 157/93   Pulse 88   Physical Exam:  General:  Alert and oriented, in no acute distress. Pulm:  Breathing unlabored. Psy:  Normal mood, congruent affect.  Neck: No thyromegaly or nodules, no lymphadenopathy. CV: Regular rate and rhythm without murmurs, rubs, or gallops.  No peripheral edema.  2+ radial and posterior tibial pulses. Lungs: Clear to auscultation throughout with no wheezing or areas of consolidation.    Imaging: No results found.  Assessment & Plan: 1.  Probable situational anxiety -We will try low-dose Xanax to use as needed.  If symptoms persist or worsen she will let me know and we will do additional testing.     Procedures: No procedures performed        PMFS History: Patient Active Problem List   Diagnosis Date Noted  . Moderate persistent  asthma without complication 73/71/0626  . Impaired cognition 02/16/2020  . Restrictive lung disease 10/13/2019  . Chronic cough 10/13/2019  . Carotid artery stenosis 09/22/2019  . OSA (obstructive sleep apnea) 09/21/2019  . History of 2019 novel coronavirus disease (COVID-19) 09/21/2019  . Shortness of breath 09/21/2019  . Hematuria 04/09/2019  . Chronic nonintractable headache 04/09/2019  . Fecal occult blood test positive 03/12/2019  . Suspected sleep apnea 12/05/2017  . Snoring 08/24/2017  . IFG (impaired fasting glucose) 09/28/2016  . Mixed hyperlipidemia 09/28/2016  . Dystrophic nail 09/28/2016  . Acute midline low back pain without sciatica 08/22/2016  . Spondylosis of cervical region without myelopathy or radiculopathy 08/22/2016  . Thyroid nodule 07/27/2016  . Chronic pain syndrome 02/20/2016  . Anxiety 11/16/2015  . Benign essential hypertension 09/14/2015  . Iron deficiency anemia due to chronic blood loss 06/16/2015  . Trigeminal neuralgia of left side of face 06/16/2015  . Allergic contact dermatitis 05/28/2015  . Hematoma - postoperative 05/16/2011  . Mass of shoulder, L scapular area  04/17/2011   Past Medical History:  Diagnosis Date  . Hyperlipidemia   . Hypertension    on medication but cant remember the name  . Lipoma   . Seroma    axillary    Family History  Problem Relation Age of Onset  .  Healthy Sister   . Healthy Brother   . Healthy Sister   . Healthy Sister   . Healthy Brother   . Healthy Brother   . Healthy Sister   . Healthy Brother     Past Surgical History:  Procedure Laterality Date  . ABDOMINAL HYSTERECTOMY  2000  . LIPOMA EXCISION     back  . seroma aspiration     Social History   Occupational History  . Not on file  Tobacco Use  . Smoking status: Never Smoker  . Smokeless tobacco: Never Used  Substance and Sexual Activity  . Alcohol use: No  . Drug use: No  . Sexual activity: Not on file

## 2020-11-25 ENCOUNTER — Other Ambulatory Visit: Payer: Self-pay

## 2020-11-25 ENCOUNTER — Encounter: Payer: Self-pay | Admitting: Physical Therapy

## 2020-11-25 ENCOUNTER — Ambulatory Visit: Payer: 59 | Attending: Family Medicine | Admitting: Physical Therapy

## 2020-11-25 DIAGNOSIS — M546 Pain in thoracic spine: Secondary | ICD-10-CM | POA: Diagnosis present

## 2020-11-25 DIAGNOSIS — M542 Cervicalgia: Secondary | ICD-10-CM | POA: Diagnosis present

## 2020-11-25 DIAGNOSIS — G44311 Acute post-traumatic headache, intractable: Secondary | ICD-10-CM | POA: Insufficient documentation

## 2020-11-25 DIAGNOSIS — M5442 Lumbago with sciatica, left side: Secondary | ICD-10-CM | POA: Insufficient documentation

## 2020-11-25 DIAGNOSIS — M25511 Pain in right shoulder: Secondary | ICD-10-CM | POA: Insufficient documentation

## 2020-11-25 DIAGNOSIS — R293 Abnormal posture: Secondary | ICD-10-CM | POA: Diagnosis present

## 2020-11-25 DIAGNOSIS — M25512 Pain in left shoulder: Secondary | ICD-10-CM | POA: Diagnosis present

## 2020-11-25 DIAGNOSIS — M5441 Lumbago with sciatica, right side: Secondary | ICD-10-CM | POA: Diagnosis present

## 2020-11-25 NOTE — Therapy (Signed)
[facility] [address]  [place], [place], [address] Phone: [phone]   Fax:  [phone]  Physical Therapy Treatment  Patient Details  Name: Shirley Hubbard MRN: [id number] Date of Birth: 12-04-1960 Referring Provider (PT): Eunice Blase, MD   Encounter Date: 11/25/2020   PT End of Session - 11/25/20 0847    Visit Number 13    Number of Visits 28    Date for PT Re-Evaluation 01/06/21    Authorization Type Aetna    PT Start Time [phone]   pt late   PT Stop Time 0855   hot pack   PT Time Calculation (min) 49 min    Activity Tolerance Patient tolerated treatment well;Patient limited by pain    Behavior During Therapy [facility] for tasks assessed/performed           Past Medical History:  Diagnosis Date  . Hyperlipidemia   . Hypertension    on medication but cant remember the name  . Lipoma   . Seroma    axillary    Past Surgical History:  Procedure Laterality Date  . ABDOMINAL HYSTERECTOMY  2000  . LIPOMA EXCISION     back  . seroma aspiration      There were no vitals filed for this visit.   Subjective Assessment - 11/25/20 0807    Subjective Still having pain between the shoulderblades but it is not as bad as last week.    Pertinent History HTN, HLD    Diagnostic tests 09/10/20 chest xray: negative; 09/10/20 head CT: negative    Patient Stated Goals decrease pain    Currently in Pain? Yes    Pain Score 5    4.5   Pain Location Shoulder    Pain Orientation Right;Left    Pain Descriptors / Indicators Aching    Pain Type Acute pain    Pain Score 5   4.5   Pain Location Back    Pain Orientation Lower    Pain Descriptors / Indicators Aching;Sharp    Pain Type Acute pain                             [facility] Adult PT Treatment/Exercise - 11/25/20 0001      Lumbar Exercises: Stretches   Lower Trunk Rotation Limitations 10x to tolerance    Quad Stretch Right;Left;1 rep    Engineer, civil (consulting) Limitations mod thomas + strap    Piriformis Stretch Right;Left;30 seconds;2 reps    Piriformis Stretch Limitations supine KTOS    Figure 4 Stretch 30 seconds;With overpressure;2 reps    Figure 4 Stretch Limitations R/L supine      Lumbar Exercises: Aerobic   Nustep L4x6 min (UEs/LEs)      Lumbar Exercises: Standing   Other Standing Lumbar Exercises TRX squat 10x   cues to shift weight back and to L     Lumbar Exercises: Supine   Bridge with clamshell 10 reps   red TB; very limited ROM     Lumbar Exercises: Quadruped   Single Arm Raise 10 reps    Single Arm Raises Limitations alt UE's   good ability to maintain neutral spine     Shoulder Exercises: Supine   Horizontal ABduction Strengthening;Both;10 reps;Theraband    Theraband Level (Shoulder Horizontal ABduction) Level 1 (Yellow)    Horizontal ABduction Limitations heavy cueing to maintain elbows extended    External Rotation Strengthening;Both;10 reps;Theraband  Theraband Level (Shoulder External Rotation) Level 1 (Yellow)    External Rotation Limitations --   slight B wrist extension which required correction     Modalities   Modalities Moist Heat      Moist Heat Therapy   Number Minutes Moist Heat 10 Minutes    Moist Heat Location Cervical                    PT Short Term Goals - 10/05/20 1328      PT SHORT TERM GOAL #1   Title Patient to be independent with initial HEP.    Time 3    Period Weeks    Status Achieved    Target Date 10/20/20             PT Long Term Goals - 11/11/20 1136      PT LONG TERM GOAL #1   Title Patient to be independent with advanced HEP.    Time 8    Period Weeks    Status Partially Met   met for current   Target Date 01/06/21      PT LONG TERM GOAL #2   Title Patient to demonstrate cervical and lumbar AROM WFL and with mild pain remaining.    Time 8    Period Weeks    Status Partially Met   improved- see objective measures   Target Date 01/06/21       PT LONG TERM GOAL #3   Title Patient to demonstrate B shoulder AROM WFL and without pain limiting.    Time 8    Period Weeks    Status Partially Met   improved- see objective measures   Target Date 01/06/21      PT LONG TERM GOAL #4   Title Patient to demonstrate B UE and LE strength >/=4+/5.    Time 8    Period Weeks    Status Partially Met   improved- see objective measures   Target Date 01/06/21      PT LONG TERM GOAL #5   Title Patient to report tolerance of 2 hours on her feet without pain limiting.    Time 8    Period Weeks    Status On-going   reports 45 min   Target Date 01/06/21      PT LONG TERM GOAL #6   Title Patient to demonstrate good body mechanics and no pain when lifting 20lbs box from floor to improve tolerance for liting at work.    Time 8    Period Weeks    Status On-going   not tested d/t time constraints   Target Date 01/06/21                 Plan - 11/25/20 0847    Clinical Impression Statement Patient reporting improvement in pain in the interscapular region since last week. Worked on gentle hip and lumbopelvic stretching to address remaining mobility deficits.  Patient still appears stiff with these exercises. Proceeded with glute strengthening with patient demonstrating limited ROM d/t weakness. Patient demonstrated difficulty with progressive periscapular and posterior shoulder strengthening today, with improved form after cueing. Able to perform squats with only minor cueing but with good tolerance and depth. Ended session with moist heat to cervical spine. Patient without complaints at end of session.    Comorbidities HTN, HLD    Rehab Potential Good    PT Frequency 2x / week    PT Duration 8 weeks  PT Treatment/Interventions ADLs/Self Care Home Management;Cryotherapy;Electrical Stimulation;Iontophoresis 4mg /ml Dexamethasone;Moist Heat;Traction;Balance training;Therapeutic exercise;Therapeutic activities;Functional mobility training;Stair  training;Gait training;Ultrasound;Neuromuscular re-education;Patient/family education;Canalith Repostioning;Manual techniques;Vestibular;Vasopneumatic Device;Taping;Energy conservation;Dry needling;Passive range of motion    PT Next Visit Plan STM/suboccipital release for HA relief; Progress cervicothoracolumbar ROM, and B UE/LE strengthening to tolerance, lumbopelvic rhythm    Consulted and Agree with Plan of Care Patient           Patient will benefit from skilled therapeutic intervention in order to improve the following deficits and impairments:  Hypomobility,Decreased endurance,Decreased activity tolerance,Decreased strength,Increased fascial restricitons,Pain,Impaired UE functional use,Decreased balance,Decreased mobility,Difficulty walking,Increased muscle spasms,Improper body mechanics,Impaired perceived functional ability,Decreased range of motion,Postural dysfunction,Impaired flexibility  Visit Diagnosis: Acute bilateral low back pain with bilateral sciatica  Pain in thoracic spine  Cervicalgia  Intractable acute post-traumatic headache  Acute pain of left shoulder  Acute pain of right shoulder  Abnormal posture     Problem List Patient Active Problem List   Diagnosis Date Noted  . Moderate persistent asthma without complication 66/59/9357  . Impaired cognition 02/16/2020  . Restrictive lung disease 10/13/2019  . Chronic cough 10/13/2019  . Carotid artery stenosis 09/22/2019  . OSA (obstructive sleep apnea) 09/21/2019  . History of 2019 novel coronavirus disease (COVID-19) 09/21/2019  . Shortness of breath 09/21/2019  . Hematuria 04/09/2019  . Chronic nonintractable headache 04/09/2019  . Fecal occult blood test positive 03/12/2019  . Suspected sleep apnea 12/05/2017  . Snoring 08/24/2017  . IFG (impaired fasting glucose) 09/28/2016  . Mixed hyperlipidemia 09/28/2016  . Dystrophic nail 09/28/2016  . Acute midline low back pain without sciatica 08/22/2016  .  Spondylosis of cervical region without myelopathy or radiculopathy 08/22/2016  . Thyroid nodule 07/27/2016  . Chronic pain syndrome 02/20/2016  . Anxiety 11/16/2015  . Benign essential hypertension 09/14/2015  . Iron deficiency anemia due to chronic blood loss 06/16/2015  . Trigeminal neuralgia of left side of face 06/16/2015  . Allergic contact dermatitis 05/28/2015  . Hematoma - postoperative 05/16/2011  . Mass of shoulder, L scapular area  04/17/2011     Janene Harvey, PT, DPT 11/25/20 8:58 AM   [facility] [address]  [place], [place], [address] Phone: [phone]   Fax:  [phone]  Name: Shirley Hubbard MRN: [id number] Date of Birth: 12-01-1960

## 2020-12-01 ENCOUNTER — Encounter: Payer: Self-pay | Admitting: Physical Therapy

## 2020-12-01 ENCOUNTER — Other Ambulatory Visit: Payer: Self-pay

## 2020-12-01 ENCOUNTER — Ambulatory Visit: Payer: 59 | Admitting: Physical Therapy

## 2020-12-01 DIAGNOSIS — M542 Cervicalgia: Secondary | ICD-10-CM

## 2020-12-01 DIAGNOSIS — M546 Pain in thoracic spine: Secondary | ICD-10-CM

## 2020-12-01 DIAGNOSIS — M5441 Lumbago with sciatica, right side: Secondary | ICD-10-CM

## 2020-12-01 DIAGNOSIS — M5442 Lumbago with sciatica, left side: Secondary | ICD-10-CM

## 2020-12-01 DIAGNOSIS — G44311 Acute post-traumatic headache, intractable: Secondary | ICD-10-CM

## 2020-12-01 NOTE — Therapy (Signed)
[facility] [address]  [place], [place], [address] Phone: [phone]   Fax:  [phone]  Physical Therapy Treatment  Patient Details  Name: Shirley Hubbard MRN: [id number] Date of Birth: 07-May-1961 Referring Provider (PT): Eunice Blase, MD   Encounter Date: 12/01/2020   PT End of Session - 12/01/20 0930    Visit Number 14    Number of Visits 28    Date for PT Re-Evaluation 01/06/21    Authorization Type Aetna    PT Start Time [phone]    PT Stop Time 0945    PT Time Calculation (min) 58 min    Activity Tolerance Patient tolerated treatment well;Patient limited by pain    Behavior During Therapy [facility] for tasks assessed/performed           Past Medical History:  Diagnosis Date  . Hyperlipidemia   . Hypertension    on medication but cant remember the name  . Lipoma   . Seroma    axillary    Past Surgical History:  Procedure Laterality Date  . ABDOMINAL HYSTERECTOMY  2000  . LIPOMA EXCISION     back  . seroma aspiration      There were no vitals filed for this visit.   Subjective Assessment - 12/01/20 0850    Subjective Just still hurting, I think I am slowly getting better, the heat and estim are the things that seem to help the most    Currently in Pain? Yes    Pain Score 5     Pain Location Back    Pain Orientation Upper;Mid;Lower    Pain Descriptors / Indicators Spasm;Aching    Aggravating Factors  movements, standing, bending will increase the pain    Pain Relieving Factors the heat and the estim help                             [facility] Adult PT Treatment/Exercise - 12/01/20 0001      Neck Exercises: Standing   Other Standing Exercises 4# shoulder shrugs, with upper trap and levator stretches    Other Standing Exercises W backs, light ball overhead reach      Lumbar Exercises: Stretches   Passive Hamstring Stretch Right;Left;3 reps;20 seconds    Piriformis  Stretch Right;Left;3 reps;20 seconds      Lumbar Exercises: Aerobic   Nustep L4x6 min (UEs/LEs)      Lumbar Exercises: Machines for Strengthening   Cybex Knee Extension 5# 2x10    Cybex Knee Flexion 10# 2x10      Lumbar Exercises: Supine   Other Supine Lumbar Exercises feet on ball K2C, trunk rotation, very small bridges and isometric abs      Moist Heat Therapy   Number Minutes Moist Heat 12 Minutes    Moist Heat Location Lumbar Spine;Cervical      Electrical Stimulation   Electrical Stimulation Location B rhomboids    Electrical Stimulation Action IFC    Electrical Stimulation Parameters supine    Electrical Stimulation Goals Tone;Pain                    PT Short Term Goals - 10/05/20 1328      PT SHORT TERM GOAL #1   Title Patient to be independent with initial HEP.    Time 3    Period Weeks    Status Achieved    Target Date 10/20/20  PT Long Term Goals - 12/01/20 0933      PT LONG TERM GOAL #1   Title Patient to be independent with advanced HEP.    Status Partially Met      PT LONG TERM GOAL #2   Title Patient to demonstrate cervical and lumbar AROM WFL and with mild pain remaining.    Status Partially Met                 Plan - 12/01/20 0931    Clinical Impression Statement Patient is very gaurded and slow with her movements.  She needed cues for all exercises to allow mms to relax and to allow good movements.  I added different exercises to try to get her moving a little more, she was fearful of all exercises and again you can see the guarding, requiring cues.    PT Next Visit Plan could try to increase movements including walking to get her to relax, she is about 10 weeks from the [place] and Agree with Plan of Care Patient           Patient will benefit from skilled therapeutic intervention in order to improve the following deficits and impairments:  Hypomobility,Decreased endurance,Decreased activity  tolerance,Decreased strength,Increased fascial restricitons,Pain,Impaired UE functional use,Decreased balance,Decreased mobility,Difficulty walking,Increased muscle spasms,Improper body mechanics,Impaired perceived functional ability,Decreased range of motion,Postural dysfunction,Impaired flexibility  Visit Diagnosis: Acute bilateral low back pain with bilateral sciatica  Pain in thoracic spine  Cervicalgia  Intractable acute post-traumatic headache     Problem List Patient Active Problem List   Diagnosis Date Noted  . Moderate persistent asthma without complication 37/62/8315  . Impaired cognition 02/16/2020  . Restrictive lung disease 10/13/2019  . Chronic cough 10/13/2019  . Carotid artery stenosis 09/22/2019  . OSA (obstructive sleep apnea) 09/21/2019  . History of 2019 novel coronavirus disease (COVID-19) 09/21/2019  . Shortness of breath 09/21/2019  . Hematuria 04/09/2019  . Chronic nonintractable headache 04/09/2019  . Fecal occult blood test positive 03/12/2019  . Suspected sleep apnea 12/05/2017  . Snoring 08/24/2017  . IFG (impaired fasting glucose) 09/28/2016  . Mixed hyperlipidemia 09/28/2016  . Dystrophic nail 09/28/2016  . Acute midline low back pain without sciatica 08/22/2016  . Spondylosis of cervical region without myelopathy or radiculopathy 08/22/2016  . Thyroid nodule 07/27/2016  . Chronic pain syndrome 02/20/2016  . Anxiety 11/16/2015  . Benign essential hypertension 09/14/2015  . Iron deficiency anemia due to chronic blood loss 06/16/2015  . Trigeminal neuralgia of left side of face 06/16/2015  . Allergic contact dermatitis 05/28/2015  . Hematoma - postoperative 05/16/2011  . Mass of shoulder, L scapular area  04/17/2011    Sumner Boast., PT 12/01/2020, 9:33 AM  [facility] [address]  [place], [place], [address] Phone: [phone]   Fax:  [phone]  Name: Shirley Hubbard MRN: [id number] Date of Birth: 10-Aug-1961

## 2020-12-02 ENCOUNTER — Ambulatory Visit: Payer: 59

## 2020-12-02 DIAGNOSIS — M25511 Pain in right shoulder: Secondary | ICD-10-CM

## 2020-12-02 DIAGNOSIS — M5442 Lumbago with sciatica, left side: Secondary | ICD-10-CM | POA: Diagnosis not present

## 2020-12-02 DIAGNOSIS — M542 Cervicalgia: Secondary | ICD-10-CM

## 2020-12-02 DIAGNOSIS — R293 Abnormal posture: Secondary | ICD-10-CM

## 2020-12-02 DIAGNOSIS — M546 Pain in thoracic spine: Secondary | ICD-10-CM

## 2020-12-02 DIAGNOSIS — M5441 Lumbago with sciatica, right side: Secondary | ICD-10-CM

## 2020-12-02 NOTE — Therapy (Addendum)
[facility] [address]  [address] [place], [place], [address] Phone: [phone]   Fax:  [phone]  Physical Therapy Treatment  Patient Details  Name: Shirley Hubbard MRN: [id number] Date of Birth: 03-Apr-1961 Referring Provider (PT): Lavada Mesi, MD   Encounter Date: 12/02/2020   PT End of Session - 12/02/20 0943    Visit Number 15    Number of Visits 28    Date for PT Re-Evaluation 01/06/21    Authorization Type Aetna    PT Start Time 0848    PT Stop Time 0948    PT Time Calculation (min) 60 min    Activity Tolerance Patient tolerated treatment well    Behavior During Therapy [facility] for tasks assessed/performed           Past Medical History:  Diagnosis Date  . Hyperlipidemia   . Hypertension    on medication but cant remember the name  . Lipoma   . Seroma    axillary    Past Surgical History:  Procedure Laterality Date  . ABDOMINAL HYSTERECTOMY  2000  . LIPOMA EXCISION     back  . seroma aspiration      There were no vitals filed for this visit.   Subjective Assessment - 12/02/20 0849    Subjective Pt reports she is still in some pain more in her R shoulder today, a little sore from therapy yesterday    Pertinent History HTN, HLD    Limitations Sitting;Lifting;Standing;Walking;House hold activities    Currently in Pain? Yes    Pain Score 5     Pain Location Neck    Pain Orientation Left;Right;Upper;Mid    Pain Descriptors / Indicators Constant                             [facility] Adult PT Treatment/Exercise - 12/02/20 0001      Neck Exercises: Standing   Other Standing Exercises 4# shoulder shrugs 10 rep    Other Standing Exercises shoulder flex with green medicine ball 10 reps      Neck Exercises: Seated   Other Seated Exercise UT and levator stretch 2x30 sec    Other Seated Exercise scap retract 15x 5 sec hold      Lumbar Exercises: Aerobic   UBE (Upper Arm Bike) 3  min    Nustep L4x5 min      Moist Heat Therapy   Number Minutes Moist Heat 12 Minutes    Moist Heat Location Cervical      Electrical Stimulation   Electrical Stimulation Location B rhomboids/ UT    Electrical Stimulation Action IFC    Electrical Stimulation Parameters supine, intensity to p tolerance 12 min    Electrical Stimulation Goals Pain      Manual Therapy   Manual Therapy Soft tissue mobilization   B rhomboids, UT   Manual therapy comments much tightness noted in R rhomboid area along with TTP                    PT Short Term Goals - 12/02/20 0956      PT SHORT TERM GOAL #1   Title Patient to be independent with initial HEP.    Status Achieved             PT Long Term Goals - 12/02/20 0956      PT LONG TERM GOAL #1   Title  Patient to be independent with advanced HEP.    Status Partially Met      PT LONG TERM GOAL #2   Title Patient to demonstrate cervical and lumbar AROM WFL and with mild pain remaining.    Status Partially Met                 Plan - 12/02/20 0944    Clinical Impression Statement Pt showed much improvement with the exercises today, shown with less guarding but still moves slow and cautiously. Patient required cues to keep shoulders down and retracted while completing exercises for better postural alignment. Multiple TTP found in R rhomboids area during STM to which patient responded well. Moist heat and estim given post treatment to decrease pain post session.    Personal Factors and Comorbidities Age;Comorbidity 2;Fitness;Past/Current Experience;Profession;Time since onset of injury/illness/exacerbation;Transportation    Examination-Activity Limitations Sit;Bed Mobility;Sleep;Squat;Bend;Stairs;Carry;Stand;Toileting;Dressing;Transfers;Hygiene/Grooming;Lift;Locomotion Level;Bathing;Reach Overhead    Examination-Participation Restrictions Church;Cleaning;School;Community Activity;Shop;Driving;Yard Work;Laundry;Meal Prep;Occupation     PT Treatment/Interventions ADLs/Self Care Home Management;Cryotherapy;Electrical Stimulation;Iontophoresis 4mg /ml Dexamethasone;Moist Heat;Traction;Balance training;Therapeutic exercise;Therapeutic activities;Functional mobility training;Stair training;Gait training;Ultrasound;Neuromuscular re-education;Patient/family education;Canalith Repostioning;Manual techniques;Vestibular;Vasopneumatic Device;Taping;Energy conservation;Dry needling;Passive range of motion    PT Next Visit Plan cont with shoulder strengthening and flexibility, STM to help muscles relax and increase extensibility           Patient will benefit from skilled therapeutic intervention in order to improve the following deficits and impairments:  Hypomobility,Decreased endurance,Decreased activity tolerance,Decreased strength,Increased fascial restricitons,Pain,Impaired UE functional use,Decreased balance,Decreased mobility,Difficulty walking,Increased muscle spasms,Improper body mechanics,Impaired perceived functional ability,Decreased range of motion,Postural dysfunction,Impaired flexibility  Visit Diagnosis: Acute bilateral low back pain with bilateral sciatica  Pain in thoracic spine  Cervicalgia  Acute pain of right shoulder  Abnormal posture     Problem List Patient Active Problem List   Diagnosis Date Noted  . Moderate persistent asthma without complication 65/46/5035  . Impaired cognition 02/16/2020  . Restrictive lung disease 10/13/2019  . Chronic cough 10/13/2019  . Carotid artery stenosis 09/22/2019  . OSA (obstructive sleep apnea) 09/21/2019  . History of 2019 novel coronavirus disease (COVID-19) 09/21/2019  . Shortness of breath 09/21/2019  . Hematuria 04/09/2019  . Chronic nonintractable headache 04/09/2019  . Fecal occult blood test positive 03/12/2019  . Suspected sleep apnea 12/05/2017  . Snoring 08/24/2017  . IFG (impaired fasting glucose) 09/28/2016  . Mixed hyperlipidemia 09/28/2016  .  Dystrophic nail 09/28/2016  . Acute midline low back pain without sciatica 08/22/2016  . Spondylosis of cervical region without myelopathy or radiculopathy 08/22/2016  . Thyroid nodule 07/27/2016  . Chronic pain syndrome 02/20/2016  . Anxiety 11/16/2015  . Benign essential hypertension 09/14/2015  . Iron deficiency anemia due to chronic blood loss 06/16/2015  . Trigeminal neuralgia of left side of face 06/16/2015  . Allergic contact dermatitis 05/28/2015  . Hematoma - postoperative 05/16/2011  . Mass of shoulder, L scapular area  04/17/2011    Artist Pais, PTA 12/02/2020, 11:52 AM  [facility] [address]  [place], [place], [address] Phone: [phone]   Fax:  [phone]  Name: Shirley Hubbard MRN: [id number] Date of Birth: 1961-02-13

## 2020-12-07 ENCOUNTER — Other Ambulatory Visit: Payer: Self-pay

## 2020-12-07 ENCOUNTER — Ambulatory Visit: Payer: 59

## 2020-12-07 DIAGNOSIS — M546 Pain in thoracic spine: Secondary | ICD-10-CM

## 2020-12-07 DIAGNOSIS — M5442 Lumbago with sciatica, left side: Secondary | ICD-10-CM

## 2020-12-07 DIAGNOSIS — M25511 Pain in right shoulder: Secondary | ICD-10-CM

## 2020-12-07 DIAGNOSIS — M5441 Lumbago with sciatica, right side: Secondary | ICD-10-CM

## 2020-12-07 DIAGNOSIS — M542 Cervicalgia: Secondary | ICD-10-CM

## 2020-12-07 DIAGNOSIS — M25512 Pain in left shoulder: Secondary | ICD-10-CM

## 2020-12-07 DIAGNOSIS — R293 Abnormal posture: Secondary | ICD-10-CM

## 2020-12-07 DIAGNOSIS — G44311 Acute post-traumatic headache, intractable: Secondary | ICD-10-CM

## 2020-12-07 NOTE — Therapy (Signed)
[facility] [address]  [place], [place], [address] Phone: [phone]   Fax:  [phone]  Physical Therapy Treatment  Patient Details  Name: Shirley Hubbard MRN: [id number] Date of Birth: Feb 18, 1961 Referring Provider (PT): Eunice Blase, MD   Encounter Date: 12/07/2020   PT End of Session - 12/07/20 0940    Visit Number 16    Number of Visits 28    Date for PT Re-Evaluation 01/06/21    Authorization Type Aetna    PT Start Time 0806    PT Stop Time 0858    PT Time Calculation (min) 52 min    Activity Tolerance Patient tolerated treatment well    Behavior During Therapy [facility] for tasks assessed/performed           Past Medical History:  Diagnosis Date  . Hyperlipidemia   . Hypertension    on medication but cant remember the name  . Lipoma   . Seroma    axillary    Past Surgical History:  Procedure Laterality Date  . ABDOMINAL HYSTERECTOMY  2000  . LIPOMA EXCISION     back  . seroma aspiration      There were no vitals filed for this visit.   Subjective Assessment - 12/07/20 0807    Subjective Pt reports that she had a very sharp pain in her back on Friday, took a muscle relaxer that helped calm it down. Also stated that she used heat and cold which helped a lot more.    Pertinent History HTN, HLD    Limitations Sitting;Lifting;Standing;Walking;House hold activities    Diagnostic tests 09/10/20 chest xray: negative; 09/10/20 head CT: negative    Patient Stated Goals decrease pain    Currently in Pain? Yes    Pain Score 5     Pain Location Shoulder    Pain Orientation Right;Left;Posterior    Pain Descriptors / Indicators Constant    Pain Type Chronic pain    Pain Score 5    Pain Location Back    Pain Orientation Right;Left;Lower    Pain Descriptors / Indicators Aching;Constant    Pain Type Acute pain                             [facility] Adult PT Treatment/Exercise -  12/07/20 0001      Lumbar Exercises: Aerobic   UBE (Upper Arm Bike) L 1.5 x3 min    Nustep L4x6 min      Lumbar Exercises: Supine   Pelvic Tilt 1 rep;10 reps    Pelvic Tilt Limitations slow and cautious movements    Clam 10 reps    Clam Limitations red tband    Bridge 10 reps    Bridge Limitations slow and cautious movements    Other Supine Lumbar Exercises marches with red tband 10 reps B      Moist Heat Therapy   Number Minutes Moist Heat 12 Minutes    Moist Heat Location Cervical      Electrical Stimulation   Electrical Stimulation Location R rhomboids/ UT    Electrical Stimulation Action IFC    Electrical Stimulation Parameters supine; intensity to pt tolerance 12 min    Electrical Stimulation Goals Pain      Manual Therapy   Manual Therapy Soft tissue mobilization    Manual therapy comments TTp along R rhomboids area and UT  PT Short Term Goals - 12/02/20 0956      PT SHORT TERM GOAL #1   Title Patient to be independent with initial HEP.    Status Achieved             PT Long Term Goals - 12/02/20 0956      PT LONG TERM GOAL #1   Title Patient to be independent with advanced HEP.    Status Partially Met      PT LONG TERM GOAL #2   Title Patient to demonstrate cervical and lumbar AROM WFL and with mild pain remaining.    Status Partially Met                 Plan - 12/07/20 0941    Clinical Impression Statement Pt reported sharp pain in her lowback over the weekend. Exercised light today in therapy d/t the pain she had over the weekend and coming into therapy. Pt had good response to the exercises, still moves slow and cautiously with most exercises. Cues required to emphasize lumbar/core stability and to isolated hip muscles. Pt cont to have tightness in her R rhomboid and UT area, STM provides relief per pt statement. Estim administered for pain control and with good reponse post session.    Personal Factors and  Comorbidities Age;Comorbidity 2;Fitness;Past/Current Experience;Profession;Time since onset of injury/illness/exacerbation;Transportation    Comorbidities HTN, HLD    Examination-Activity Limitations Sit;Bed Mobility;Sleep;Squat;Bend;Stairs;Carry;Stand;Toileting;Dressing;Transfers;Hygiene/Grooming;Lift;Locomotion Level;Bathing;Reach Overhead    Examination-Participation Restrictions Church;Cleaning;School;Community Activity;Shop;Driving;Yard Work;Laundry;Meal Prep;Occupation    PT Frequency 2x / week    PT Duration 8 weeks    PT Treatment/Interventions ADLs/Self Care Home Management;Cryotherapy;Electrical Stimulation;Iontophoresis 4mg /ml Dexamethasone;Moist Heat;Traction;Balance training;Therapeutic exercise;Therapeutic activities;Functional mobility training;Stair training;Gait training;Ultrasound;Neuromuscular re-education;Patient/family education;Canalith Repostioning;Manual techniques;Vestibular;Vasopneumatic Device;Taping;Energy conservation;Dry needling;Passive range of motion    PT Next Visit Plan cont with shoulder strengthening and flexibility, STM to help muscles relax and increase extensibility           Patient will benefit from skilled therapeutic intervention in order to improve the following deficits and impairments:  Hypomobility,Decreased endurance,Decreased activity tolerance,Decreased strength,Increased fascial restricitons,Pain,Impaired UE functional use,Decreased balance,Decreased mobility,Difficulty walking,Increased muscle spasms,Improper body mechanics,Impaired perceived functional ability,Decreased range of motion,Postural dysfunction,Impaired flexibility  Visit Diagnosis: Acute bilateral low back pain with bilateral sciatica  Pain in thoracic spine  Cervicalgia  Acute pain of right shoulder  Abnormal posture  Intractable acute post-traumatic headache  Acute pain of left shoulder     Problem List Patient Active Problem List   Diagnosis Date Noted  .  Moderate persistent asthma without complication 04/21/1600  . Impaired cognition 02/16/2020  . Restrictive lung disease 10/13/2019  . Chronic cough 10/13/2019  . Carotid artery stenosis 09/22/2019  . OSA (obstructive sleep apnea) 09/21/2019  . History of 2019 novel coronavirus disease (COVID-19) 09/21/2019  . Shortness of breath 09/21/2019  . Hematuria 04/09/2019  . Chronic nonintractable headache 04/09/2019  . Fecal occult blood test positive 03/12/2019  . Suspected sleep apnea 12/05/2017  . Snoring 08/24/2017  . IFG (impaired fasting glucose) 09/28/2016  . Mixed hyperlipidemia 09/28/2016  . Dystrophic nail 09/28/2016  . Acute midline low back pain without sciatica 08/22/2016  . Spondylosis of cervical region without myelopathy or radiculopathy 08/22/2016  . Thyroid nodule 07/27/2016  . Chronic pain syndrome 02/20/2016  . Anxiety 11/16/2015  . Benign essential hypertension 09/14/2015  . Iron deficiency anemia due to chronic blood loss 06/16/2015  . Trigeminal neuralgia of left side of face 06/16/2015  . Allergic contact dermatitis 05/28/2015  . Hematoma - postoperative  05/16/2011  . Mass of shoulder, L scapular area  04/17/2011    Artist Pais, PTA 12/07/2020, 9:55 AM  [facility] [address]  [place], [place], [address] Phone: [phone]   Fax:  [phone]  Name: Shirley Hubbard MRN: [id number] Date of Birth: November 25, 1960

## 2020-12-10 ENCOUNTER — Ambulatory Visit (INDEPENDENT_AMBULATORY_CARE_PROVIDER_SITE_OTHER): Payer: 59 | Admitting: Family Medicine

## 2020-12-10 ENCOUNTER — Other Ambulatory Visit: Payer: Self-pay

## 2020-12-10 DIAGNOSIS — M545 Low back pain, unspecified: Secondary | ICD-10-CM

## 2020-12-10 DIAGNOSIS — M542 Cervicalgia: Secondary | ICD-10-CM | POA: Diagnosis not present

## 2020-12-10 DIAGNOSIS — M546 Pain in thoracic spine: Secondary | ICD-10-CM
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
## 2020-12-14 ENCOUNTER — Encounter: Payer: Self-pay | Admitting: Physical Therapy

## 2020-12-14 ENCOUNTER — Other Ambulatory Visit: Payer: Self-pay

## 2020-12-14 ENCOUNTER — Ambulatory Visit: Payer: 59 | Admitting: Physical Therapy

## 2020-12-14 DIAGNOSIS — G44311 Acute post-traumatic headache, intractable: Secondary | ICD-10-CM

## 2020-12-14 DIAGNOSIS — M5442 Lumbago with sciatica, left side: Secondary | ICD-10-CM | POA: Diagnosis not present

## 2020-12-14 DIAGNOSIS — M5441 Lumbago with sciatica, right side: Secondary | ICD-10-CM

## 2020-12-14 DIAGNOSIS — M25512 Pain in left shoulder: Secondary | ICD-10-CM

## 2020-12-14 DIAGNOSIS — M542 Cervicalgia: Secondary | ICD-10-CM

## 2020-12-14 DIAGNOSIS — R293 Abnormal posture: Secondary | ICD-10-CM

## 2020-12-14 DIAGNOSIS — M25511 Pain in right shoulder: Secondary | ICD-10-CM

## 2020-12-14 DIAGNOSIS — M546 Pain in thoracic spine: Secondary | ICD-10-CM

## 2020-12-14 NOTE — Patient Instructions (Signed)

## 2020-12-14 NOTE — Therapy (Signed)
[facility] [address]  [place], [place], [address] Phone: [phone]   Fax:  [phone]  Physical Therapy Treatment  Patient Details  Name: Shirley Hubbard MRN: [id number] Date of Birth: 1961-03-14 Referring Provider (PT): Eunice Blase, MD   Encounter Date: 12/14/2020   PT End of Session - 12/14/20 1012    Visit Number 17    Number of Visits 28    Date for PT Re-Evaluation 01/06/21    Authorization Type [employer]    PT Start Time [phone]    PT Stop Time 1011    PT Time Calculation (min) 38 min    Activity Tolerance Patient tolerated treatment well    Behavior During Therapy [facility] for tasks assessed/performed           Past Medical History:  Diagnosis Date  . Hyperlipidemia   . Hypertension    on medication but cant remember the name  . Lipoma   . Seroma    axillary    Past Surgical History:  Procedure Laterality Date  . ABDOMINAL HYSTERECTOMY  2000  . LIPOMA EXCISION     back  . seroma aspiration      There were no vitals filed for this visit.   Subjective Assessment - 12/14/20 0936    Subjective Reports that she has had DN in the past and remembers it being painful, however is agreeable to try it again. Asking if water aerobics would help her and asking if any other things may help. Notes HAs every other day.    Pertinent History HTN, HLD    Diagnostic tests 09/10/20 chest xray: negative; 09/10/20 head CT: negative    Patient Stated Goals decrease pain    Currently in Pain? Yes    Pain Score 5    4.5/10   Pain Location Back    Pain Orientation Mid;Upper    Pain Descriptors / Indicators Constant    Pain Type Chronic pain                             [facility] Adult PT Treatment/Exercise - 12/14/20 0001      Lumbar Exercises: Stretches   Figure 4 Stretch 30 seconds;With overpressure;2 reps    Figure 4 Stretch Limitations R/L supine      Lumbar Exercises: Aerobic    Nustep L4x6 min      Manual Therapy   Manual Therapy Soft tissue mobilization;Myofascial release    Manual therapy comments sitting, prone    Soft tissue mobilization STM to B upper shoulders and midback- most TTP over R UT, LS, B lower rhomboids, and Rsuboccipitals; STM to B LB and buttocks- most TTP along B lower lumbar paraspinals, B glute med, B piriformis, R>L    Myofascial Release manual TPR to R rhomboids, B piriformis, R proximal glutes and glute med                  PT Education - 12/14/20 0948    Education Details edu on DN expectations, precautions, post-treatment; Encouraged patient to try water aerobics, chair yoga, and increasing walking and physical activity around the home to ease into PLOF.    Person(s) Educated Patient    Methods Explanation;Demonstration;Tactile cues;Verbal cues;Handout    Comprehension Verbalized understanding;Returned demonstration            PT Short Term Goals - 12/02/20 0956      PT SHORT TERM  GOAL #1   Title Patient to be independent with initial HEP.    Status Achieved             PT Long Term Goals - 12/02/20 0956      PT LONG TERM GOAL #1   Title Patient to be independent with advanced HEP.    Status Partially Met      PT LONG TERM GOAL #2   Title Patient to demonstrate cervical and lumbar AROM WFL and with mild pain remaining.    Status Partially Met                 Plan - 12/14/20 1013    Clinical Impression Statement Patient reporting having had a bad experience with DN to her shoulder in the past, but agreeable to try it again. Patient was thoroughly educated on DN and post-treatment expectations. Patient also asking about what other things may be helpful to her, thus educated patient on the benefits of water aerobics, chair yoga, and walking program to slowly increase activity levels and return to PLOF. Encouraged patient to try water aerobics and increasing walking and physical activity around the home to  ease into PLOF. Assessed soft tissue restriction and pain along cervical, midback, lumbar, and buttocks musculature to determine areas that may benefit most from DN. Patient particularly tight on R>L. Patient reported understanding of all edu provided today and declined modalities at end of session.    Personal Factors and Comorbidities Age;Comorbidity 2;Fitness;Past/Current Experience;Profession;Time since onset of injury/illness/exacerbation;Transportation    Comorbidities HTN, HLD    Examination-Activity Limitations Sit;Bed Mobility;Sleep;Squat;Bend;Stairs;Carry;Stand;Toileting;Dressing;Transfers;Hygiene/Grooming;Lift;Locomotion Level;Bathing;Reach Overhead    Examination-Participation Restrictions Church;Cleaning;School;Community Activity;Shop;Driving;Yard Work;Laundry;Meal Prep;Occupation    PT Frequency 2x / week    PT Duration 8 weeks    PT Treatment/Interventions ADLs/Self Care Home Management;Cryotherapy;Electrical Stimulation;Iontophoresis 4mg /ml Dexamethasone;Moist Heat;Traction;Balance training;Therapeutic exercise;Therapeutic activities;Functional mobility training;Stair training;Gait training;Ultrasound;Neuromuscular re-education;Patient/family education;Canalith Repostioning;Manual techniques;Vestibular;Vasopneumatic Device;Taping;Energy conservation;Dry needling;Passive range of motion    PT Next Visit Plan cont with shoulder strengthening and flexibility, STM to help muscles relax and increase extensibility    Consulted and Agree with Plan of Care Patient           Patient will benefit from skilled therapeutic intervention in order to improve the following deficits and impairments:  Hypomobility,Decreased endurance,Decreased activity tolerance,Decreased strength,Increased fascial restricitons,Pain,Impaired UE functional use,Decreased balance,Decreased mobility,Difficulty walking,Increased muscle spasms,Improper body mechanics,Impaired perceived functional ability,Decreased range of  motion,Postural dysfunction,Impaired flexibility  Visit Diagnosis: Acute bilateral low back pain with bilateral sciatica  Pain in thoracic spine  Cervicalgia  Acute pain of right shoulder  Abnormal posture  Intractable acute post-traumatic headache  Acute pain of left shoulder     Problem List Patient Active Problem List   Diagnosis Date Noted  . Moderate persistent asthma without complication 67/61/9509  . Impaired cognition 02/16/2020  . Restrictive lung disease 10/13/2019  . Chronic cough 10/13/2019  . Carotid artery stenosis 09/22/2019  . OSA (obstructive sleep apnea) 09/21/2019  . History of 2019 novel coronavirus disease (COVID-19) 09/21/2019  . Shortness of breath 09/21/2019  . Hematuria 04/09/2019  . Chronic nonintractable headache 04/09/2019  . Fecal occult blood test positive 03/12/2019  . Suspected sleep apnea 12/05/2017  . Snoring 08/24/2017  . IFG (impaired fasting glucose) 09/28/2016  . Mixed hyperlipidemia 09/28/2016  . Dystrophic nail 09/28/2016  . Acute midline low back pain without sciatica 08/22/2016  . Spondylosis of cervical region without myelopathy or radiculopathy 08/22/2016  . Thyroid nodule 07/27/2016  . Chronic pain syndrome 02/20/2016  .  Anxiety 11/16/2015  . Benign essential hypertension 09/14/2015  . Iron deficiency anemia due to chronic blood loss 06/16/2015  . Trigeminal neuralgia of left side of face 06/16/2015  . Allergic contact dermatitis 05/28/2015  . Hematoma - postoperative 05/16/2011  . Mass of shoulder, L scapular area  04/17/2011      Janene Harvey, PT, DPT 12/14/20 11:06 AM   [facility] [address]  [place], [place], [address] Phone: [phone]   Fax:  [phone]  Name: Shirley Hubbard MRN: [id number] Date of Birth: April 02, 1961

## 2020-12-16 ENCOUNTER — Ambulatory Visit: Payer: 59

## 2020-12-16 ENCOUNTER — Other Ambulatory Visit: Payer: Self-pay

## 2020-12-16 DIAGNOSIS — R293 Abnormal posture: Secondary | ICD-10-CM

## 2020-12-16 DIAGNOSIS — M25512 Pain in left shoulder: Secondary | ICD-10-CM

## 2020-12-16 DIAGNOSIS — M5442 Lumbago with sciatica, left side: Secondary | ICD-10-CM | POA: Diagnosis not present

## 2020-12-16 DIAGNOSIS — M5441 Lumbago with sciatica, right side: Secondary | ICD-10-CM

## 2020-12-16 DIAGNOSIS — M25511 Pain in right shoulder: Secondary | ICD-10-CM

## 2020-12-16 DIAGNOSIS — M546 Pain in thoracic spine: Secondary | ICD-10-CM

## 2020-12-16 DIAGNOSIS — G44311 Acute post-traumatic headache, intractable: Secondary | ICD-10-CM

## 2020-12-16 DIAGNOSIS — M542 Cervicalgia: Secondary | ICD-10-CM

## 2020-12-16 NOTE — Therapy (Signed)
[facility] [address]  [place], [place], [address] Phone: [phone]   Fax:  [phone]  Physical Therapy Treatment  Patient Details  Name: Shirley Hubbard MRN: [id number] Date of Birth: 1960/12/31 Referring Provider (PT): Eunice Blase, MD   Encounter Date: 12/16/2020   PT End of Session - 12/16/20 1107    Visit Number 18    Number of Visits 28    Date for PT Re-Evaluation 01/06/21    Authorization Type [employer]et[employer]    PT Start Time 1100    PT Stop Time 1152   session end 5 min moist heat   PT Time Calculation (min) 52 min    Activity Tolerance Patient tolerated treatment well    Behavior During Therapy [facility] for tasks assessed/performed           Past Medical History:  Diagnosis Date  . Hyperlipidemia   . Hypertension    on medication but cant remember the name  . Lipoma   . Seroma    axillary    Past Surgical History:  Procedure Laterality Date  . ABDOMINAL HYSTERECTOMY  2000  . LIPOMA EXCISION     back  . seroma aspiration      There were no vitals filed for this visit.   Subjective Assessment - 12/16/20 1105    Subjective Back pain continues today, no headaches today but had one yesterday    Pertinent History HTN, HLD    Limitations Sitting;Lifting;Standing;Walking;House hold activities    How long can you sit comfortably? 10 min    How long can you stand comfortably? 10-15 min    How long can you walk comfortably? 10-15 min    Diagnostic tests 09/10/20 chest xray: negative; 09/10/20 head CT: negative    Patient Stated Goals decrease pain    Currently in Pain? Yes    Pain Score 4     Pain Location Back    Pain Orientation Mid;Upper    Pain Descriptors / Indicators Constant    Pain Radiating Towards Right inferior shoulder blade "knot"                             [facility] Adult PT Treatment/Exercise - 12/16/20 0001      Neck Exercises: Seated   Other Seated  Exercise Seated trunk twists B , 5 deep slow breaths each direction x 2   no pain, only stretch felt. hand to opposite knee   Other Seated Exercise Shoulder rolls backward 2x 10, Seated horizontal shoulder abd x10 BW only.  Small range cervical AROM circles x5 CW, x5 CCW      Lumbar Exercises: Stretches   Figure 4 Stretch 30 seconds;2 reps    Figure 4 Stretch Limitations R/L Seated      Lumbar Exercises: Aerobic   Nustep L4x6 min      Manual Therapy   Manual Therapy Soft tissue mobilization;Myofascial release;Scapular mobilization    Manual therapy comments left sidelying    Soft tissue mobilization STM to R upper shoulders and midback- most TTP over R UT, LS,  suboccipitals, B lower rhomboids, and Rsuboccipitals; TTP also noted at R temporalis, masseter, and submandibular mms responded nicely to sustained pressure and circular STM.    Myofascial Release manual TPR to R UT and LS,  rhomboids, R periscapular muscles, tender points at medial and inferior scapular borders.    Scapular Mobilization manually assisted scap mobility  all directions  with TPR,                  PT Education - 12/16/20 1205    Education Details Pt educated on incorporating gentle cervical and trunk AROM, suggested use of calm/meditation apps for guided breathing and relaxation to decrease tension. Reinforcedencouragement for trying water aerobics, chair yoga, and increasing physical activity as tolerated.    Person(s) Educated Patient    Methods Demonstration;Explanation;Verbal cues    Comprehension Verbalized understanding;Returned demonstration;Need further instruction            PT Short Term Goals - 12/02/20 0956      PT SHORT TERM GOAL #1   Title Patient to be independent with initial HEP.    Status Achieved             PT Long Term Goals - 12/02/20 0956      PT LONG TERM GOAL #1   Title Patient to be independent with advanced HEP.    Status Partially Met      PT LONG TERM GOAL #2    Title Patient to demonstrate cervical and lumbar AROM WFL and with mild pain remaining.    Status Partially Met                 Plan - 12/16/20 1108    Clinical Impression Statement Pt tolerated session well today. Initiated some gentle midback strengthening exercises start of session with good tolerance. Pt demonstrates alot of stiffness and tension overall, keeps head in line with body with most functional movements. She was educated in looking for calm/meditation listening programs on music apps or youtube to incorporate at least 5-10 minute sessions of deep breathing with relaxation at home. She was also educated in use of seated or supine neck and trunk AROM/stretches within comfortable ranges to gradually improve mobility and prevent further tightness. Manual tx was tolerated nicely today although with many areas TTP with ms spasm in right periscapular region as well as into the neck, suboccipitals R temporalis and masseter. Session concluded with 5 min moist heat to neck with good tolerance    Personal Factors and Comorbidities Age;Comorbidity 2;Fitness;Past/Current Experience;Profession;Time since onset of injury/illness/exacerbation;Transportation    Comorbidities HTN, HLD    Examination-Activity Limitations Sit;Bed Mobility;Sleep;Squat;Bend;Stairs;Carry;Stand;Toileting;Dressing;Transfers;Hygiene/Grooming;Lift;Locomotion Level;Bathing;Reach Overhead    Examination-Participation Restrictions Church;Cleaning;School;Community Activity;Shop;Driving;Yard Work;Laundry;Meal Prep;Occupation    Rehab Potential Good    PT Frequency 2x / week    PT Duration 8 weeks    PT Treatment/Interventions ADLs/Self Care Home Management;Cryotherapy;Electrical Stimulation;Iontophoresis 4mg /ml Dexamethasone;Moist Heat;Traction;Balance training;Therapeutic exercise;Therapeutic activities;Functional mobility training;Stair training;Gait training;Ultrasound;Neuromuscular re-education;Patient/family  education;Canalith Repostioning;Manual techniques;Vestibular;Vasopneumatic Device;Taping;Energy conservation;Dry needling;Passive range of motion    PT Next Visit Plan cont with shoulder strengthening and flexibility, STM to help muscles relax and increase extensibility. She may benefit from incorporating gradually progressive AA/AROM exercises for neck and thoracic spine mobility.    Consulted and Agree with Plan of Care Patient           Patient will benefit from skilled therapeutic intervention in order to improve the following deficits and impairments:  Hypomobility,Decreased endurance,Decreased activity tolerance,Decreased strength,Increased fascial restricitons,Pain,Impaired UE functional use,Decreased balance,Decreased mobility,Difficulty walking,Increased muscle spasms,Improper body mechanics,Impaired perceived functional ability,Decreased range of motion,Postural dysfunction,Impaired flexibility  Visit Diagnosis: Cervicalgia  Acute bilateral low back pain with bilateral sciatica  Pain in thoracic spine  Acute pain of right shoulder  Abnormal posture  Intractable acute post-traumatic headache  Acute pain of left shoulder     Problem List Patient Active Problem  List   Diagnosis Date Noted  . Moderate persistent asthma without complication 24/46/9507  . Impaired cognition 02/16/2020  . Restrictive lung disease 10/13/2019  . Chronic cough 10/13/2019  . Carotid artery stenosis 09/22/2019  . OSA (obstructive sleep apnea) 09/21/2019  . History of 2019 novel coronavirus disease (COVID-19) 09/21/2019  . Shortness of breath 09/21/2019  . Hematuria 04/09/2019  . Chronic nonintractable headache 04/09/2019  . Fecal occult blood test positive 03/12/2019  . Suspected sleep apnea 12/05/2017  . Snoring 08/24/2017  . IFG (impaired fasting glucose) 09/28/2016  . Mixed hyperlipidemia 09/28/2016  . Dystrophic nail 09/28/2016  . Acute midline low back pain without sciatica  08/22/2016  . Spondylosis of cervical region without myelopathy or radiculopathy 08/22/2016  . Thyroid nodule 07/27/2016  . Chronic pain syndrome 02/20/2016  . Anxiety 11/16/2015  . Benign essential hypertension 09/14/2015  . Iron deficiency anemia due to chronic blood loss 06/16/2015  . Trigeminal neuralgia of left side of face 06/16/2015  . Allergic contact dermatitis 05/28/2015  . Hematoma - postoperative 05/16/2011  . Mass of shoulder, L scapular area  04/17/2011    Hall Busing , PT, DPT 12/16/2020, 12:07 PM  [facility] [address]  [place], [place], [address] Phone: [phone]   Fax:  [phone]  Name: Shirley Hubbard MRN: [id number] Date of Birth: 11/14/1960

## 2020-12-17 ENCOUNTER — Telehealth: Payer: Self-pay

## 2020-12-17 NOTE — Telephone Encounter (Signed)
Faxed updated work note, from 12/20/20 to Teena Dunk [phone] (extends her until 01/31/21 - see under letters).

## 2020-12-21 ENCOUNTER — Other Ambulatory Visit: Payer: Self-pay

## 2020-12-21 ENCOUNTER — Encounter: Payer: Self-pay | Admitting: Physical Therapy

## 2020-12-21 ENCOUNTER — Ambulatory Visit: Payer: 59 | Attending: Family Medicine | Admitting: Physical Therapy

## 2020-12-21 DIAGNOSIS — M25512 Pain in left shoulder: Secondary | ICD-10-CM | POA: Diagnosis present

## 2020-12-21 DIAGNOSIS — M5441 Lumbago with sciatica, right side: Secondary | ICD-10-CM

## 2020-12-21 DIAGNOSIS — M542 Cervicalgia: Secondary | ICD-10-CM

## 2020-12-21 DIAGNOSIS — M546 Pain in thoracic spine: Secondary | ICD-10-CM

## 2020-12-21 DIAGNOSIS — G44311 Acute post-traumatic headache, intractable: Secondary | ICD-10-CM

## 2020-12-21 DIAGNOSIS — M5442 Lumbago with sciatica, left side: Secondary | ICD-10-CM | POA: Diagnosis present

## 2020-12-21 DIAGNOSIS — R293 Abnormal posture: Secondary | ICD-10-CM | POA: Diagnosis present

## 2020-12-21 DIAGNOSIS — M25511 Pain in right shoulder: Secondary | ICD-10-CM

## 2020-12-21 NOTE — Therapy (Signed)
[facility] [address]  [place], [place], [address] Phone: [phone]   Fax:  [phone]  Physical Therapy Treatment  Patient Details  Name: Shirley Hubbard MRN: [id number] Date of Birth: 1961-06-05 Referring Provider (PT): Eunice Blase, MD   Encounter Date: 12/21/2020   PT End of Session - 12/21/20 0803    Visit Number 19    Number of Visits 28    Date for PT Re-Evaluation 01/06/21    Authorization Type [employer]et[employer]    PT Start Time 0804    PT Stop Time 0901    PT Time Calculation (min) 57 min    Activity Tolerance Patient tolerated treatment well    Behavior During Therapy [facility] for tasks assessed/performed           Past Medical History:  Diagnosis Date  . Hyperlipidemia   . Hypertension    on medication but cant remember the name  . Lipoma   . Seroma    axillary    Past Surgical History:  Procedure Laterality Date  . ABDOMINAL HYSTERECTOMY  2000  . LIPOMA EXCISION     back  . seroma aspiration      There were no vitals filed for this visit.   Subjective Assessment - 12/21/20 0806    Subjective Pt would like to try DN for upper back and periscapular muscles.    Pertinent History HTN, HLD    Limitations Sitting;Lifting;Standing;Walking;House hold activities    Diagnostic tests 09/10/20 chest xray: negative; 09/10/20 head CT: negative    Patient Stated Goals decrease pain    Currently in Pain? Yes    Pain Score 4    3.5/10   Pain Location Back    Pain Orientation Mid;Upper    Pain Descriptors / Indicators Constant    Pain Type Chronic pain    Pain Score 4    Pain Location Back    Pain Orientation Lower    Pain Descriptors / Indicators Aching;Constant                             [facility] Adult PT Treatment/Exercise - 12/21/20 0804      Exercises   Exercises Lumbar      Lumbar Exercises: Aerobic   Nustep L4 x 6 min      Manual Therapy   Manual Therapy Soft tissue  mobilization;Myofascial release    Manual therapy comments skilled palpation and monitoring during DN    Soft tissue mobilization STM to R upper shoulders and midback- most TTP over R UT, LS, lower rhomboids    Myofascial Release manual TPR to R UT and LS,  rhomboids - decreased TTP following DN      Neck Exercises: Stretches   Upper Trapezius Stretch Right;2 reps;Left;1 rep;30 seconds    Levator Stretch Right;2 reps;Left;1 rep;30 seconds    Other Neck Stretches B rhomboids stretch 2 x 30 sec            Trigger Point Dry Needling - 12/21/20 0804    Consent Given? Yes    Education Handout Provided Previously provided    Muscles Treated Head and Neck Upper trapezius;Levator scapulae   Rt   Muscles Treated Upper Quadrant Rhomboids   Rt   Upper Trapezius Response Twitch reponse elicited;Palpable increased muscle length    Levator Scapulae Response Twitch response elicited;Palpable increased muscle length    Rhomboids Response Twitch response elicited;Palpable increased  muscle length                PT Education - 12/21/20 0845    Education Details DN rational, procedures, outcomes, potential side effects, and recommended post-treatment exercises/activity; HEP update - Access Code: [email or web address]    Person(s) Educated Patient;Spouse    Methods Explanation;Demonstration;Verbal cues;Handout    Comprehension Verbalized understanding;Verbal cues required;Returned demonstration;Need further instruction            PT Short Term Goals - 12/02/20 0956      PT SHORT TERM GOAL #1   Title Patient to be independent with initial HEP.    Status Achieved             PT Long Term Goals - 12/02/20 0956      PT LONG TERM GOAL #1   Title Patient to be independent with advanced HEP.    Status Partially Met      PT LONG TERM GOAL #2   Title Patient to demonstrate cervical and lumbar AROM WFL and with mild pain remaining.    Status Partially Met                 Plan - 12/21/20  0901    Clinical Impression Statement Shirley Hubbard expressing interest in DN per MD recommendations but acknowledged apprehension due to previous negative experience with DN. After explanation of DN rational, procedures, outcomes and potential side effects, patient verbalized consent to DN treatment in conjunction with manual STM/DTM and TPR to reduce ttp/muscle tension, with pt aware that she can request discontinuation of DN at any time. Muscles treated include R UT, LS and mid/lower rhomboids. DN produced normal response with good twitches elicited resulting in palpable reduction in pain/ttp and muscle tension. Pt educated to expect mild to moderate muscle soreness for up to 24-48 hrs and instructed to continue prescribed home exercise program, including addition of new stretches targeting muscles treated today, and current activity level with pt verbalizing understanding of theses instructions. Session concluded with estim and moist heat to neck & upper back to promote further muscle relaxation/relief from muscle spasm.    Personal Factors and Comorbidities Age;Comorbidity 2;Fitness;Past/Current Experience;Profession;Time since onset of injury/illness/exacerbation;Transportation    Comorbidities HTN, HLD    Examination-Activity Limitations Sit;Bed Mobility;Sleep;Squat;Bend;Stairs;Carry;Stand;Toileting;Dressing;Transfers;Hygiene/Grooming;Lift;Locomotion Level;Bathing;Reach Overhead    Examination-Participation Restrictions Church;Cleaning;School;Community Activity;Shop;Driving;Yard Work;Laundry;Meal Prep;Occupation    Rehab Potential Good    PT Frequency 2x / week    PT Duration 8 weeks    PT Treatment/Interventions ADLs/Self Care Home Management;Cryotherapy;Electrical Stimulation;Iontophoresis 43m/ml Dexamethasone;Moist Heat;Traction;Balance training;Therapeutic exercise;Therapeutic activities;Functional mobility training;Stair training;Gait training;Ultrasound;Neuromuscular re-education;Patient/family  education;Canalith Repostioning;Manual techniques;Vestibular;Vasopneumatic Device;Taping;Energy conservation;Dry needling;Passive range of motion    PT Next Visit Plan cont with shoulder strengthening and flexibility, STM to help muscles relax and increase extensibility. She may benefit from incorporating gradually progressive AA/AROM exercises for neck and thoracic spine mobility.    PT Home Exercise Plan Access Code: [email or web address])    Consulted and Agree with Plan of Care Patient           Patient will benefit from skilled therapeutic intervention in order to improve the following deficits and impairments:  Hypomobility,Decreased endurance,Decreased activity tolerance,Decreased strength,Increased fascial restricitons,Pain,Impaired UE functional use,Decreased balance,Decreased mobility,Difficulty walking,Increased muscle spasms,Improper body mechanics,Impaired perceived functional ability,Decreased range of motion,Postural dysfunction,Impaired flexibility  Visit Diagnosis: Acute bilateral low back pain with bilateral sciatica  Pain in thoracic spine  Cervicalgia  Intractable acute post-traumatic headache  Acute pain of right shoulder  Acute pain of left shoulder  Abnormal posture  Problem List Patient Active Problem List   Diagnosis Date Noted  . Moderate persistent asthma without complication 58/30/9407  . Impaired cognition 02/16/2020  . Restrictive lung disease 10/13/2019  . Chronic cough 10/13/2019  . Carotid artery stenosis 09/22/2019  . OSA (obstructive sleep apnea) 09/21/2019  . History of 2019 novel coronavirus disease (COVID-19) 09/21/2019  . Shortness of breath 09/21/2019  . Hematuria 04/09/2019  . Chronic nonintractable headache 04/09/2019  . Fecal occult blood test positive 03/12/2019  . Suspected sleep apnea 12/05/2017  . Snoring 08/24/2017  . IFG (impaired fasting glucose) 09/28/2016  . Mixed hyperlipidemia 09/28/2016  . Dystrophic nail 09/28/2016  .  Acute midline low back pain without sciatica 08/22/2016  . Spondylosis of cervical region without myelopathy or radiculopathy 08/22/2016  . Thyroid nodule 07/27/2016  . Chronic pain syndrome 02/20/2016  . Anxiety 11/16/2015  . Benign essential hypertension 09/14/2015  . Iron deficiency anemia due to chronic blood loss 06/16/2015  . Trigeminal neuralgia of left side of face 06/16/2015  . Allergic contact dermatitis 05/28/2015  . Hematoma - postoperative 05/16/2011  . Mass of shoulder, L scapular area  04/17/2011    Percival Spanish, PT, MPT 12/21/2020, 12:44 PM  [facility] [address]  [place], [place], [address] Phone: [phone]   Fax:  [phone]  Name: Shirley Hubbard MRN: [id number] Date of Birth: [birth year]

## 2020-12-21 NOTE — Patient Instructions (Addendum)
    Access Code: [email or web address] URL: [email or web address] Date: 12/21/2020 Prepared by: Annie Paras  Exercises Seated Gentle Upper Trapezius Stretch - 2-3 x daily - 7 x weekly - 3 reps - 30 sec hold Gentle Levator Scapulae Stretch - 2-3 x daily - 7 x weekly - 3 reps - 30 sec hold Seated Rhomboid Stretch - 2-3 x daily - 7 x weekly - 3 reps - 30 sec hold

## 2020-12-23 ENCOUNTER — Ambulatory Visit: Payer: 59 | Admitting: Physical Therapy

## 2020-12-23 ENCOUNTER — Encounter: Payer: Self-pay | Admitting: Physical Therapy

## 2020-12-23 ENCOUNTER — Other Ambulatory Visit: Payer: Self-pay

## 2020-12-23 DIAGNOSIS — M542 Cervicalgia: Secondary | ICD-10-CM

## 2020-12-23 DIAGNOSIS — M546 Pain in thoracic spine: Secondary | ICD-10-CM

## 2020-12-23 DIAGNOSIS — R293 Abnormal posture: Secondary | ICD-10-CM

## 2020-12-23 DIAGNOSIS — M5442 Lumbago with sciatica, left side: Secondary | ICD-10-CM | POA: Diagnosis not present

## 2020-12-23 DIAGNOSIS — M25511 Pain in right shoulder: Secondary | ICD-10-CM

## 2020-12-23 DIAGNOSIS — M5441 Lumbago with sciatica, right side: Secondary | ICD-10-CM

## 2020-12-23 DIAGNOSIS — G44311 Acute post-traumatic headache, intractable: Secondary | ICD-10-CM

## 2020-12-23 DIAGNOSIS — M25512 Pain in left shoulder: Secondary | ICD-10-CM

## 2020-12-23 NOTE — Therapy (Signed)
[facility] [address]  [place], [place], [address] Phone: [phone]   Fax:  [phone]  Physical Therapy Treatment / Progress Note  Patient Details  Name: Shirley Hubbard MRN: [id number] Date of Birth: [birth year] Referring Provider (PT): Eunice Blase, MD  Progress Note  Reporting Period 11/11/20 to 12/23/20  See note below for Objective Data and Assessment of Progress/Goals.      Encounter Date: 12/23/2020   PT End of Session - 12/23/20 0806    Visit Number 20    Number of Visits 28    Date for PT Re-Evaluation 01/06/21    Authorization Type Aet[employer]    PT Start Time 0806    PT Stop Time 0851    PT Time Calculation (min) 45 min    Activity Tolerance Patient tolerated treatment well    Behavior During Therapy [facility] for tasks assessed/performed           Past Medical History:  Diagnosis Date  . Hyperlipidemia   . Hypertension    on medication but cant remember the name  . Lipoma   . Seroma    axillary    Past Surgical History:  Procedure Laterality Date  . ABDOMINAL HYSTERECTOMY  2000  . LIPOMA EXCISION     back  . seroma aspiration      There were no vitals filed for this visit.   Subjective Assessment - 12/23/20 0809    Subjective Pt feels that DN helped with the pain across her shoulders ("to my suprise"). Still notes feeling of a knot in mid back.    Pertinent History HTN, HLD    Limitations Sitting;Lifting;Standing;Walking;House hold activities    How long can you sit comfortably? 20-30 min    How long can you stand comfortably? 45-60 min    How long can you walk comfortably? 45-60 min    Diagnostic tests 09/10/20 chest xray: negative; 09/10/20 head CT: negative    Patient Stated Goals decrease pain    Currently in Pain? Yes    Pain Score 3     Pain Location Back    Pain Orientation Mid;Upper    Pain Descriptors / Indicators Constant    Pain Type Chronic pain    Pain  Frequency Constant    Pain Score 4    Pain Location Back    Pain Orientation Lower    Pain Descriptors / Indicators Aching;Constant    Pain Type Acute pain    Pain Frequency Constant              [facility] PT Assessment - 12/23/20 0806      Assessment   Medical Diagnosis Neck pain, pain in thoracic spine, acute LBP, acute pain of R shoulder, pain in R/L leg    Referring Provider (PT) Eunice Blase, MD    Next MD Visit 01/21/21      AROM   Right Shoulder Flexion 140 Degrees    Right Shoulder ABduction 121 Degrees    Right Shoulder Internal Rotation --   FIR T9   Right Shoulder External Rotation --   FER T2   Left Shoulder Flexion 122 Degrees    Left Shoulder ABduction 129 Degrees    Left Shoulder Internal Rotation --   FIR lateral to T9   Left Shoulder External Rotation --   FER T2   Cervical Flexion 36 - pulling    Cervical Extension 20 - tight > pain    Cervical -  Right Side Bend 29 - tight    Cervical - Left Side Bend 25 - tight    Cervical - Right Rotation 39 - tight > pain    Cervical - Left Rotation 41 - tight > pain    Lumbar Flexion mid/distal shin - tight/pulling    Lumbar Extension moderately limited - pain    Lumbar - Right Side Bend just below knee    Lumbar - Left Side Bend knee joint line    Lumbar - Right Rotation moderately limited    Lumbar - Left Rotation mild/moderately limited      Strength   Right Shoulder Flexion 4+/5    Right Shoulder ABduction 4+/5    Right Shoulder Internal Rotation 4+/5    Right Shoulder External Rotation 4+/5    Left Shoulder Flexion 4/5    Left Shoulder ABduction 4/5    Left Shoulder Internal Rotation 4+/5    Left Shoulder External Rotation 4+/5    Right Hand Grip (lbs) 14.67   20, 14, 10   Left Hand Grip (lbs) 8.67   10, 8, 8   Right Hip Flexion 4+/5    Right Hip ABduction 4+/5    Right Hip ADduction 4+/5    Left Hip Flexion 4+/5    Left Hip ABduction 4+/5    Left Hip ADduction 4+/5    Right Knee Flexion 5/5    Right  Knee Extension 5/5    Left Knee Flexion 5/5    Left Knee Extension 5/5    Right Ankle Dorsiflexion 4/5    Right Ankle Plantar Flexion 4+/5    Left Ankle Dorsiflexion 5/5    Left Ankle Plantar Flexion 4+/5                         [facility] Adult PT Treatment/Exercise - 12/23/20 0806      Self-Care   Self-Care Lifting    Lifting Provided instruction in proper posture and body mechancis for lift tasks to reduce neck, back and shoulder strain.      Therapeutic Activites    Therapeutic Activities Lifting    Lifting Practiced lifting techniques - initial attempt with 20# wooded box too difficult, therefore switched to 10# cuff weight placed in shoe box starting from height on top of wooden box, then lowering to 9" stool and finally 2" step. Pt able to use good mechanics at all heights with increase in pain.      Lumbar Exercises: Aerobic   Nustep L4 x 6 min                    PT Short Term Goals - 12/02/20 0956      PT SHORT TERM GOAL #1   Title Patient to be independent with initial HEP.    Status Achieved             PT Long Term Goals - 12/23/20 4356      PT LONG TERM GOAL #1   Title Patient to be independent with advanced HEP.    Time 8    Period Weeks    Status Partially Met   met for current   Target Date 01/06/21      PT LONG TERM GOAL #2   Title Patient to demonstrate cervical and lumbar AROM WFL and with mild pain remaining.    Time 8    Period Weeks    Status Partially Met   improved- see objective measures  PT LONG TERM GOAL #3   Title Patient to demonstrate B shoulder AROM WFL and without pain limiting.    Time 8    Period Weeks    Status Partially Met   improved- see objective measures   Target Date 01/06/21      PT LONG TERM GOAL #4   Title Patient to demonstrate B UE and LE strength >/=4+/5.    Time 8    Period Weeks    Status Partially Met   improved- see objective measures   Target Date 01/06/21      PT LONG TERM GOAL  #5   Title Patient to report tolerance of 2 hours on her feet without pain limiting.    Time 8    Period Weeks    Status On-going   12/23/20 - 45-60 min   Target Date 01/06/21      PT LONG TERM GOAL #6   Title Patient to demonstrate good body mechanics and no pain when lifting 20lbs box from floor to improve tolerance for liting at work.    Time 8    Period Weeks    Status Partially Met   12/23/20 - able to lift 10# box from floor   Target Date 01/06/21                 Plan - 12/23/20 0851    Clinical Impression Statement Carmella reports good relief of upper shoulder pain from DN last session but still notes increased pain/tightness in mid back which remains uncomfortable when sitting with back against something. She reports improvement in positional/activity tolerance across all positions with most significant improvement in standing and walking tolerance which has increased from 10-15 min to 45-60 min. She continues to demonstrate improvement in cervical, shoulder and lumbar ROM as well as UE and LE strength including 10# gain in R grip strength. She was able to lift a 10# box from floor height with good posture and body mechanics but struggled with a heavier/larger box. Overall, Aviela continues to demonstrate good progress with therapy with progress noted toward all of her LTGs. She will continue to benefit from skilled PT to further reduce pain and improve ROM, strength and functional activity tolerance.    Personal Factors and Comorbidities Age;Comorbidity 2;Fitness;Past/Current Experience;Profession;Time since onset of injury/illness/exacerbation;Transportation    Comorbidities HTN, HLD    Examination-Activity Limitations Sit;Bed Mobility;Sleep;Squat;Bend;Stairs;Carry;Stand;Toileting;Dressing;Transfers;Hygiene/Grooming;Lift;Locomotion Level;Bathing;Reach Overhead    Examination-Participation Restrictions Church;Cleaning;School;Community Activity;Shop;Driving;Yard Work;Laundry;Meal  Prep;Occupation    Rehab Potential Good    PT Frequency 2x / week    PT Duration 8 weeks    PT Treatment/Interventions ADLs/Self Care Home Management;Cryotherapy;Electrical Stimulation;Iontophoresis 4mg /ml Dexamethasone;Moist Heat;Traction;Balance training;Therapeutic exercise;Therapeutic activities;Functional mobility training;Stair training;Gait training;Ultrasound;Neuromuscular re-education;Patient/family education;Canalith Repostioning;Manual techniques;Vestibular;Vasopneumatic Device;Taping;Energy conservation;Dry needling;Passive range of motion    PT Next Visit Plan cont with shoulder strengthening and flexibility, STM to help muscles relax and increase extensibility. She may benefit from incorporating gradually progressive AA/AROM exercises for neck and thoracic spine mobility.    PT Home Exercise Plan Access Code: [email or web address] (3/1)    Consulted and Agree with Plan of Care Patient           Patient will benefit from skilled therapeutic intervention in order to improve the following deficits and impairments:  Hypomobility,Decreased endurance,Decreased activity tolerance,Decreased strength,Increased fascial restricitons,Pain,Impaired UE functional use,Decreased balance,Decreased mobility,Difficulty walking,Increased muscle spasms,Improper body mechanics,Impaired perceived functional ability,Decreased range of motion,Postural dysfunction,Impaired flexibility  Visit Diagnosis: Acute bilateral low back pain with bilateral sciatica  Pain in thoracic spine  Cervicalgia  Intractable acute post-traumatic headache  Acute pain of right shoulder  Acute pain of left shoulder  Abnormal posture     Problem List Patient Active Problem List   Diagnosis Date Noted  . Moderate persistent asthma without complication 52/17/4715  . Impaired cognition 02/16/2020  . Restrictive lung disease 10/13/2019  . Chronic cough 10/13/2019  . Carotid artery stenosis 09/22/2019  . OSA (obstructive sleep  apnea) 09/21/2019  . History of 2019 novel coronavirus disease (COVID-19) 09/21/2019  . Shortness of breath 09/21/2019  . Hematuria 04/09/2019  . Chronic nonintractable headache 04/09/2019  . Fecal occult blood test positive 03/12/2019  . Suspected sleep apnea 12/05/2017  . Snoring 08/24/2017  . IFG (impaired fasting glucose) 09/28/2016  . Mixed hyperlipidemia 09/28/2016  . Dystrophic nail 09/28/2016  . Acute midline low back pain without sciatica 08/22/2016  . Spondylosis of cervical region without myelopathy or radiculopathy 08/22/2016  . Thyroid nodule 07/27/2016  . Chronic pain syndrome 02/20/2016  . Anxiety 11/16/2015  . Benign essential hypertension 09/14/2015  . Iron deficiency anemia due to chronic blood loss 06/16/2015  . Trigeminal neuralgia of left side of face 06/16/2015  . Allergic contact dermatitis 05/28/2015  . Hematoma - postoperative 05/16/2011  . Mass of shoulder, L scapular area  04/17/2011    Percival Spanish, PT, MPT 12/23/2020, 12:30 PM  [facility] [address]  [place], [place], [address] Phone: [phone]   Fax:  [phone]  Name: Nadiah Corbit MRN: [id number] Date of Birth: 1961-04-06

## 2020-12-27 ENCOUNTER — Telehealth: Payer: Self-pay | Admitting: Family Medicine

## 2020-12-27 NOTE — Telephone Encounter (Signed)
[employer] forms received. Sent to [facility] 

## 2020-12-28 ENCOUNTER — Other Ambulatory Visit: Payer: Self-pay | Admitting: Family Medicine

## 2020-12-28 ENCOUNTER — Other Ambulatory Visit: Payer: Self-pay

## 2020-12-28 ENCOUNTER — Ambulatory Visit: Payer: 59

## 2020-12-28 DIAGNOSIS — M25512 Pain in left shoulder: Secondary | ICD-10-CM

## 2020-12-28 DIAGNOSIS — M25511 Pain in right shoulder: Secondary | ICD-10-CM

## 2020-12-28 DIAGNOSIS — M5442 Lumbago with sciatica, left side: Secondary | ICD-10-CM

## 2020-12-28 DIAGNOSIS — M542 Cervicalgia: Secondary | ICD-10-CM

## 2020-12-28 DIAGNOSIS — M5441 Lumbago with sciatica, right side: Secondary | ICD-10-CM

## 2020-12-28 DIAGNOSIS — M546 Pain in thoracic spine: Secondary | ICD-10-CM

## 2020-12-28 NOTE — Therapy (Signed)
[facility] [address]  [place], [place], [address] Phone: [phone]   Fax:  [phone]  Physical Therapy Treatment  Patient Details  Name: Shirley Hubbard MRN: [id number] Date of Birth: Jul 31, 1961 Referring Provider (PT): Eunice Blase, MD   Encounter Date: 12/28/2020   PT End of Session - 12/28/20 0930    Visit Number 21    Number of Visits 28    Date for PT Re-Evaluation 01/06/21    Authorization Type [employer]    PT Start Time 3151    PT Stop Time 0938    PT Time Calculation (min) 51 min    Activity Tolerance Patient tolerated treatment well    Behavior During Therapy [facility] for tasks assessed/performed           Past Medical History:  Diagnosis Date  . Hyperlipidemia   . Hypertension    on medication but cant remember the name  . Lipoma   . Seroma    axillary    Past Surgical History:  Procedure Laterality Date  . ABDOMINAL HYSTERECTOMY  2000  . LIPOMA EXCISION     back  . seroma aspiration      There were no vitals filed for this visit.   Subjective Assessment - 12/28/20 0847    Subjective Pt reports she feels like she has been improving    Pertinent History HTN, HLD    Diagnostic tests 09/10/20 chest xray: negative; 09/10/20 head CT: negative    Patient Stated Goals decrease pain    Currently in Pain? Yes    Pain Score 3     Pain Location Back    Pain Orientation Lower;Right;Upper    Pain Descriptors / Indicators Constant    Pain Type Chronic pain                             [facility] Adult PT Treatment/Exercise - 12/28/20 0001      Exercises   Exercises Shoulder;Lumbar      Lumbar Exercises: Aerobic   Nustep L4 x 6 min      Lumbar Exercises: Seated   Sit to Stand 10 reps;Limitations    Sit to Stand Limitations medicine ball reaching to the ceiling    Other Seated Lumbar Exercises ab set 15 reps with physioball      Shoulder Exercises: Seated   Other  Seated Exercises 3 way prayer stretch, 10x10 each      Shoulder Exercises: Standing   Flexion AROM;Both;10 reps;Limitations    Flexion Limitations Y medicine ball; back against wall    Other Standing Exercises trunk rotations with medicine ball 10 reps      Moist Heat Therapy   Number Minutes Moist Heat 10 Minutes    Moist Heat Location Cervical      Manual Therapy   Manual Therapy Soft tissue mobilization;Myofascial release    Soft tissue mobilization R rhomboids and levator    Myofascial Release TPR to R rhomboids with gentle stretch                    PT Short Term Goals - 12/02/20 0956      PT SHORT TERM GOAL #1   Title Patient to be independent with initial HEP.    Status Achieved             PT Long Term Goals - 12/23/20 7616  PT LONG TERM GOAL #1   Title Patient to be independent with advanced HEP.    Time 8    Period Weeks    Status Partially Met   met for current   Target Date 01/06/21      PT LONG TERM GOAL #2   Title Patient to demonstrate cervical and lumbar AROM WFL and with mild pain remaining.    Time 8    Period Weeks    Status Partially Met   improved- see objective measures     PT LONG TERM GOAL #3   Title Patient to demonstrate B shoulder AROM WFL and without pain limiting.    Time 8    Period Weeks    Status Partially Met   improved- see objective measures   Target Date 01/06/21      PT LONG TERM GOAL #4   Title Patient to demonstrate B UE and LE strength >/=4+/5.    Time 8    Period Weeks    Status Partially Met   improved- see objective measures   Target Date 01/06/21      PT LONG TERM GOAL #5   Title Patient to report tolerance of 2 hours on her feet without pain limiting.    Time 8    Period Weeks    Status On-going   12/23/20 - 45-60 min   Target Date 01/06/21      PT LONG TERM GOAL #6   Title Patient to demonstrate good body mechanics and no pain when lifting 20lbs box from floor to improve tolerance for liting at  work.    Time 8    Period Weeks    Status Partially Met   12/23/20 - able to lift 10# box from floor   Target Date 01/06/21                 Plan - 12/28/20 0930    Clinical Impression Statement Pt responded well to treatment, focused on shoulder ROM and spinal mobility today. She was tight with trunk rotations shown with decreased ROM in each direction. She did exercises slow and cautiously, also is still guarded with movements especially involoving the shoulders and neck. STM to R rhomboids, pt had TTP in the area, she responded well to DTM, with palpable decrease in tightness afterward. Ended session with moist heat to B shoulder and neck to inrease tissue extensibility.    Personal Factors and Comorbidities Age;Comorbidity 2;Fitness;Past/Current Experience;Profession;Time since onset of injury/illness/exacerbation;Transportation    Comorbidities HTN, HLD    PT Frequency 2x / week    PT Duration 8 weeks    PT Treatment/Interventions ADLs/Self Care Home Management;Cryotherapy;Electrical Stimulation;Iontophoresis 27m/ml Dexamethasone;Moist Heat;Traction;Balance training;Therapeutic exercise;Therapeutic activities;Functional mobility training;Stair training;Gait training;Ultrasound;Neuromuscular re-education;Patient/family education;Canalith Repostioning;Manual techniques;Vestibular;Vasopneumatic Device;Taping;Energy conservation;Dry needling;Passive range of motion    PT Next Visit Plan cont with shoulder strengthening and flexibility, STM to help muscles relax and increase extensibility. She may benefit from incorporating gradually progressive AA/AROM exercises for neck and thoracic spine mobility.    PT Home Exercise Plan Access Code: [email or web address])    Consulted and Agree with Plan of Care Patient           Patient will benefit from skilled therapeutic intervention in order to improve the following deficits and impairments:  Hypomobility,Decreased endurance,Decreased activity  tolerance,Decreased strength,Increased fascial restricitons,Pain,Impaired UE functional use,Decreased balance,Decreased mobility,Difficulty walking,Increased muscle spasms,Improper body mechanics,Impaired perceived functional ability,Decreased range of motion,Postural dysfunction,Impaired flexibility  Visit Diagnosis: Acute bilateral low back pain with bilateral sciatica  Pain in thoracic spine  Cervicalgia  Acute pain of right shoulder  Acute pain of left shoulder     Problem List Patient Active Problem List   Diagnosis Date Noted  . Moderate persistent asthma without complication 29/93/7169  . Impaired cognition 02/16/2020  . Restrictive lung disease 10/13/2019  . Chronic cough 10/13/2019  . Carotid artery stenosis 09/22/2019  . OSA (obstructive sleep apnea) 09/21/2019  . History of 2019 novel coronavirus disease (COVID-19) 09/21/2019  . Shortness of breath 09/21/2019  . Hematuria 04/09/2019  . Chronic nonintractable headache 04/09/2019  . Fecal occult blood test positive 03/12/2019  . Suspected sleep apnea 12/05/2017  . Snoring 08/24/2017  . IFG (impaired fasting glucose) 09/28/2016  . Mixed hyperlipidemia 09/28/2016  . Dystrophic nail 09/28/2016  . Acute midline low back pain without sciatica 08/22/2016  . Spondylosis of cervical region without myelopathy or radiculopathy 08/22/2016  . Thyroid nodule 07/27/2016  . Chronic pain syndrome 02/20/2016  . Anxiety 11/16/2015  . Benign essential hypertension 09/14/2015  . Iron deficiency anemia due to chronic blood loss 06/16/2015  . Trigeminal neuralgia of left side of face 06/16/2015  . Allergic contact dermatitis 05/28/2015  . Hematoma - postoperative 05/16/2011  . Mass of shoulder, L scapular area  04/17/2011    Artist Pais, PTA 12/28/2020, 9:59 AM  [facility] [address]  [place], [place], [address] Phone: [phone]   Fax:   [phone]  Name: Shirley Hubbard MRN: [id number] Date of Birth: Dec 07, 1960

## 2020-12-30 ENCOUNTER — Ambulatory Visit: Payer: 59 | Admitting: Physical Therapy

## 2020-12-30 ENCOUNTER — Encounter: Payer: Self-pay | Admitting: Physical Therapy

## 2020-12-30 ENCOUNTER — Other Ambulatory Visit: Payer: Self-pay

## 2020-12-30 DIAGNOSIS — M5442 Lumbago with sciatica, left side: Secondary | ICD-10-CM

## 2020-12-30 DIAGNOSIS — G44311 Acute post-traumatic headache, intractable: Secondary | ICD-10-CM

## 2020-12-30 DIAGNOSIS — M542 Cervicalgia: Secondary | ICD-10-CM

## 2020-12-30 DIAGNOSIS — R293 Abnormal posture: Secondary | ICD-10-CM

## 2020-12-30 DIAGNOSIS — M25511 Pain in right shoulder: Secondary | ICD-10-CM

## 2020-12-30 DIAGNOSIS — M5441 Lumbago with sciatica, right side: Secondary | ICD-10-CM

## 2020-12-30 DIAGNOSIS — M25512 Pain in left shoulder: Secondary | ICD-10-CM

## 2020-12-30 DIAGNOSIS — M546 Pain in thoracic spine: Secondary | ICD-10-CM

## 2020-12-30 NOTE — Therapy (Signed)
[facility] [address]  [place], [place], [address] Phone: [phone]   Fax:  [phone]  Physical Therapy Treatment  Patient Details  Name: Shirley Hubbard MRN: [id number] Date of Birth: 02-15-1961 Referring Provider (PT): Eunice Blase, MD   Encounter Date: 12/30/2020   PT End of Session - 12/30/20 0804    Visit Number 22    Number of Visits 28    Date for PT Re-Evaluation 01/06/21    Authorization Type [employer]et[employer]    PT Start Time 0804    PT Stop Time 0858    PT Time Calculation (min) 54 min    Activity Tolerance Patient tolerated treatment well    Behavior During Therapy [facility] for tasks assessed/performed           Past Medical History:  Diagnosis Date  . Hyperlipidemia   . Hypertension    on medication but cant remember the name  . Lipoma   . Seroma    axillary    Past Surgical History:  Procedure Laterality Date  . ABDOMINAL HYSTERECTOMY  2000  . LIPOMA EXCISION     back  . seroma aspiration      There were no vitals filed for this visit.   Subjective Assessment - 12/30/20 0806    Subjective Pt reports the pain seems to be worst near her R shoulder blade. She would like to try DN again.    Pertinent History HTN, HLD    Diagnostic tests 09/10/20 chest xray: negative; 09/10/20 head CT: negative    Patient Stated Goals decrease pain    Currently in Pain? Yes    Pain Score 3     Pain Location Shoulder    Pain Orientation Right    Pain Descriptors / Indicators Constant    Pain Score 4   3.5/10   Pain Location Scapula    Pain Orientation Right    Pain Descriptors / Indicators Aching;Constant    Pain Score 3    Pain Location Back    Pain Orientation Lower    Pain Descriptors / Indicators Tightness                             [facility] Adult PT Treatment/Exercise - 12/30/20 0804      Lumbar Exercises: Aerobic   Nustep L4 x 6 min      Modalities   Modalities  Electrical Stimulation;Moist Heat      Moist Heat Therapy   Number Minutes Moist Heat 15 Minutes    Moist Heat Location Shoulder   & thoracic spine     Electrical Stimulation   Electrical Stimulation Location R thoracic paraspinals & periscapular muscles    Electrical Stimulation Action IFC    Electrical Stimulation Parameters 80-150hz ; output to tolerance; 15 min    Electrical Stimulation Goals Pain;Tone      Manual Therapy   Manual Therapy Soft tissue mobilization;Myofascial release;Scapular mobilization    Manual therapy comments skilled palpation and monitoring during DN    Soft tissue mobilization STM to R periscapular muscles and midback- most TTP over R medial scapula    Myofascial Release manual TPR to R rhomboids, lower traps, subscapularis, infraspinatus & thoracic paraspinals - decreased TTP following DN    Scapular Mobilization manually assisted scap mobility all directions  with TPR            Trigger Point Dry Needling - 12/30/20 9379  Consent Given? Yes    Muscles Treated Upper Quadrant Rhomboids;Lower trapezius;Subscapularis;Infraspinatus   Rt   Muscles Treated Back/Hip Erector spinae    Rhomboids Response Twitch response elicited;Palpable increased muscle length    Infraspinatus Response Twitch response elicited;Palpable increased muscle length    Subscapularis Response Twitch response elicited;Palpable increased muscle length    Lower trapezius Response Twitch response elicited;Palpable increased muscle length    Erector spinae Response Twitch response elicited;Palpable increased muscle length   Rt thoracic                 PT Short Term Goals - 12/02/20 0956      PT SHORT TERM GOAL #1   Title Patient to be independent with initial HEP.    Status Achieved             PT Long Term Goals - 12/23/20 5427      PT LONG TERM GOAL #1   Title Patient to be independent with advanced HEP.    Time 8    Period Weeks    Status Partially Met   met for  current   Target Date 01/06/21      PT LONG TERM GOAL #2   Title Patient to demonstrate cervical and lumbar AROM WFL and with mild pain remaining.    Time 8    Period Weeks    Status Partially Met   improved- see objective measures     PT LONG TERM GOAL #3   Title Patient to demonstrate B shoulder AROM WFL and without pain limiting.    Time 8    Period Weeks    Status Partially Met   improved- see objective measures   Target Date 01/06/21      PT LONG TERM GOAL #4   Title Patient to demonstrate B UE and LE strength >/=4+/5.    Time 8    Period Weeks    Status Partially Met   improved- see objective measures   Target Date 01/06/21      PT LONG TERM GOAL #5   Title Patient to report tolerance of 2 hours on her feet without pain limiting.    Time 8    Period Weeks    Status On-going   12/23/20 - 45-60 min   Target Date 01/06/21      PT LONG TERM GOAL #6   Title Patient to demonstrate good body mechanics and no pain when lifting 20lbs box from floor to improve tolerance for liting at work.    Time 8    Period Weeks    Status Partially Met   12/23/20 - able to lift 10# box from floor   Target Date 01/06/21                 Plan - 12/30/20 0810    Clinical Impression Statement Nawal reporting benefit from prior DN and expressing interest in further DN today. Pt with difficulty localizing pain but generalizing pain to R mid/lower back, periscapular muscles and shoulder. Taut bands and TPs identified in R rhomboids, lower traps, subscapularis, infraspinatus & thoracic paraspinals - addressed with manual therapy incorporating DN with good twitch responses elicited resulting in palpable reduction in muscle tension & decreased TTP following DN. Session concluded with estim and moist heat to promote further muscle relaxation/relief from muscle spasm.    Personal Factors and Comorbidities Age;Comorbidity 2;Fitness;Past/Current Experience;Profession;Time since onset of  injury/illness/exacerbation;Transportation    Comorbidities HTN, HLD    PT Frequency 2x /  week    PT Duration 8 weeks    PT Treatment/Interventions ADLs/Self Care Home Management;Cryotherapy;Electrical Stimulation;Iontophoresis 4mg /ml Dexamethasone;Moist Heat;Traction;Balance training;Therapeutic exercise;Therapeutic activities;Functional mobility training;Stair training;Gait training;Ultrasound;Neuromuscular re-education;Patient/family education;Canalith Repostioning;Manual techniques;Vestibular;Vasopneumatic Device;Taping;Energy conservation;Dry needling;Passive range of motion    PT Next Visit Plan cont with shoulder strengthening and flexibility, STM to help muscles relax and increase extensibility. She may benefit from incorporating gradually progressive AA/AROM exercises for neck and thoracic spine mobility.    PT Home Exercise Plan Access Code: [email or web address] (3/1)    Consulted and Agree with Plan of Care Patient           Patient will benefit from skilled therapeutic intervention in order to improve the following deficits and impairments:  Hypomobility,Decreased endurance,Decreased activity tolerance,Decreased strength,Increased fascial restricitons,Pain,Impaired UE functional use,Decreased balance,Decreased mobility,Difficulty walking,Increased muscle spasms,Improper body mechanics,Impaired perceived functional ability,Decreased range of motion,Postural dysfunction,Impaired flexibility  Visit Diagnosis: Acute bilateral low back pain with bilateral sciatica  Pain in thoracic spine  Cervicalgia  Acute pain of right shoulder  Acute pain of left shoulder  Intractable acute post-traumatic headache  Abnormal posture     Problem List Patient Active Problem List   Diagnosis Date Noted  . Moderate persistent asthma without complication 62/19/4712  . Impaired cognition 02/16/2020  . Restrictive lung disease 10/13/2019  . Chronic cough 10/13/2019  . Carotid artery stenosis 09/22/2019   . OSA (obstructive sleep apnea) 09/21/2019  . History of 2019 novel coronavirus disease (COVID-19) 09/21/2019  . Shortness of breath 09/21/2019  . Hematuria 04/09/2019  . Chronic nonintractable headache 04/09/2019  . Fecal occult blood test positive 03/12/2019  . Suspected sleep apnea 12/05/2017  . Snoring 08/24/2017  . IFG (impaired fasting glucose) 09/28/2016  . Mixed hyperlipidemia 09/28/2016  . Dystrophic nail 09/28/2016  . Acute midline low back pain without sciatica 08/22/2016  . Spondylosis of cervical region without myelopathy or radiculopathy 08/22/2016  . Thyroid nodule 07/27/2016  . Chronic pain syndrome 02/20/2016  . Anxiety 11/16/2015  . Benign essential hypertension 09/14/2015  . Iron deficiency anemia due to chronic blood loss 06/16/2015  . Trigeminal neuralgia of left side of face 06/16/2015  . Allergic contact dermatitis 05/28/2015  . Hematoma - postoperative 05/16/2011  . Mass of shoulder, L scapular area  04/17/2011    Percival Spanish, PT, MPT 12/30/2020, 9:56 AM  [facility] [address]  [place], [place], [address] Phone: [phone]   Fax:  [phone]  Name: Aamya Orellana MRN: [id number] Date of Birth: Jun 11, 1961

## 2021-01-03 ENCOUNTER — Other Ambulatory Visit: Payer: Self-pay

## 2021-01-03 ENCOUNTER — Encounter: Payer: Self-pay | Admitting: Physical Therapy

## 2021-01-03 ENCOUNTER — Ambulatory Visit: Payer: 59 | Admitting: Physical Therapy

## 2021-01-03 DIAGNOSIS — M546 Pain in thoracic spine: Secondary | ICD-10-CM

## 2021-01-03 DIAGNOSIS — R293 Abnormal posture: Secondary | ICD-10-CM

## 2021-01-03 DIAGNOSIS — M25511 Pain in right shoulder: Secondary | ICD-10-CM

## 2021-01-03 DIAGNOSIS — M5441 Lumbago with sciatica, right side: Secondary | ICD-10-CM

## 2021-01-03 DIAGNOSIS — M25512 Pain in left shoulder: Secondary | ICD-10-CM

## 2021-01-03 DIAGNOSIS — M5442 Lumbago with sciatica, left side: Secondary | ICD-10-CM | POA: Diagnosis not present

## 2021-01-03 DIAGNOSIS — G44311 Acute post-traumatic headache, intractable: Secondary | ICD-10-CM

## 2021-01-03 DIAGNOSIS — M542 Cervicalgia: Secondary | ICD-10-CM

## 2021-01-03 NOTE — Therapy (Signed)
[facility] [address]  [place], [place], [address] Phone: [phone]   Fax:  [phone]  Physical Therapy Treatment  Patient Details  Name: Shirley Hubbard MRN: [id number] Date of Birth: 07-05-1961 Referring Provider (PT): Eunice Blase, MD   Encounter Date: 01/03/2021   PT End of Session - 01/03/21 0934    Visit Number 23    Number of Visits 28    Date for PT Re-Evaluation 01/06/21    Authorization Type Aet[employer]    PT Start Time [phone]    PT Stop Time 1028    PT Time Calculation (min) 54 min    Activity Tolerance Patient tolerated treatment well    Behavior During Therapy [facility] for tasks assessed/performed           Past Medical History:  Diagnosis Date  . Hyperlipidemia   . Hypertension    on medication but cant remember the name  . Lipoma   . Seroma    axillary    Past Surgical History:  Procedure Laterality Date  . ABDOMINAL HYSTERECTOMY  2000  . LIPOMA EXCISION     back  . seroma aspiration      There were no vitals filed for this visit.   Subjective Assessment - 01/03/21 0937    Subjective Pt reports pain seems to be better but still there with decreased intensity - feels like DN is helping. Notes a R-sided headache today.    Pertinent History HTN, HLD    Diagnostic tests 09/10/20 chest xray: negative; 09/10/20 head CT: negative    Patient Stated Goals decrease pain    Currently in Pain? Yes    Pain Location Scapula    Pain Orientation Right    Pain Descriptors / Indicators Constant;Tender   "nervy"   Pain Type Chronic pain    Pain Score 2    Pain Location Back    Pain Orientation Lower    Pain Descriptors / Indicators Sore    Pain Type Acute pain    Pain Score 3    Pain Location Head    Pain Orientation Right    Pain Descriptors / Indicators Aching    Pain Type Acute pain                             [facility] Adult PT Treatment/Exercise - 01/03/21 0934       Lumbar Exercises: Aerobic   Nustep L4 x 6 min      Manual Therapy   Manual Therapy Soft tissue mobilization;Myofascial release;Passive ROM;Manual Traction    Manual therapy comments skilled palpation and monitoring during DN    Soft tissue mobilization STM to R upper shoulders and neck - most TTP over R UT, LS, scalenes    Myofascial Release B suboccipital release; manual TPR to R UT and LS,  scalenes - decreased TTP following DN    Passive ROM manual R UT, LS and scalene stretches x 30 sec each    Manual Traction gentle cervical distraction 3 x 30"            Trigger Point Dry Needling - 01/03/21 0934    Consent Given? Yes    Muscles Treated Head and Neck Upper trapezius;Levator scapulae;Scalenes;Splenius capitus;Semispinalis capitus   Rt   Upper Trapezius Response Twitch reponse elicited;Palpable increased muscle length    Levator Scapulae Response Twitch response elicited;Palpable increased muscle length    Scalenes  Response Twitch reponse elicited;Palpable increased muscle length    Splenius capitus Response Twitch reponse elicited;Palpable increased muscle length    Semispinalis capitus Response Twitch reponse elicited;Palpable increased muscle length                  PT Short Term Goals - 12/02/20 0956      PT SHORT TERM GOAL #1   Title Patient to be independent with initial HEP.    Status Achieved             PT Long Term Goals - 12/23/20 0981      PT LONG TERM GOAL #1   Title Patient to be independent with advanced HEP.    Time 8    Period Weeks    Status Partially Met   met for current   Target Date 01/06/21      PT LONG TERM GOAL #2   Title Patient to demonstrate cervical and lumbar AROM WFL and with mild pain remaining.    Time 8    Period Weeks    Status Partially Met   improved- see objective measures     PT LONG TERM GOAL #3   Title Patient to demonstrate B shoulder AROM WFL and without pain limiting.    Time 8    Period Weeks    Status  Partially Met   improved- see objective measures   Target Date 01/06/21      PT LONG TERM GOAL #4   Title Patient to demonstrate B UE and LE strength >/=4+/5.    Time 8    Period Weeks    Status Partially Met   improved- see objective measures   Target Date 01/06/21      PT LONG TERM GOAL #5   Title Patient to report tolerance of 2 hours on her feet without pain limiting.    Time 8    Period Weeks    Status On-going   12/23/20 - 45-60 min   Target Date 01/06/21      PT LONG TERM GOAL #6   Title Patient to demonstrate good body mechanics and no pain when lifting 20lbs box from floor to improve tolerance for liting at work.    Time 8    Period Weeks    Status Partially Met   12/23/20 - able to lift 10# box from floor   Target Date 01/06/21                 Plan - 01/03/21 0940    Clinical Impression Statement Cheyna reports benefit from DN noting continued improvement in overall pain levels. Pain today mostly localized to R side of neck and upper shoulder as well as periscapular muscles with pt also noting a R-sided headache. DN performed in conjunction with MT targeting cervical paraspinals and scalenes/UT/LS with pt noting reduction in intensity of her headache following DN and MT. Session concluded with estim and moist heat to promote further reduction in muscle tension and pain. Pt will f/u with primary PT next visit for reassessment to determine plan for further course of treatment.    Personal Factors and Comorbidities Age;Comorbidity 2;Fitness;Past/Current Experience;Profession;Time since onset of injury/illness/exacerbation;Transportation    Comorbidities HTN, HLD    Rehab Potential Good    PT Frequency 2x / week    PT Duration 8 weeks    PT Treatment/Interventions ADLs/Self Care Home Management;Cryotherapy;Electrical Stimulation;Iontophoresis 83m/ml Dexamethasone;Moist Heat;Traction;Balance training;Therapeutic exercise;Therapeutic activities;Functional mobility  training;Stair training;Gait training;Ultrasound;Neuromuscular re-education;Patient/family education;Canalith Repostioning;Manual techniques;Vestibular;Vasopneumatic Device;Taping;Energy conservation;Dry  needling;Passive range of motion    PT Next Visit Plan Recert; cont with shoulder strengthening and flexibility, STM to help muscles relax and increase extensibility. She may benefit from incorporating gradually progressive AA/AROM exercises for neck and thoracic spine mobility.    PT Home Exercise Plan Access Code: [email or web address])    Consulted and Agree with Plan of Care Patient           Patient will benefit from skilled therapeutic intervention in order to improve the following deficits and impairments:  Hypomobility,Decreased endurance,Decreased activity tolerance,Decreased strength,Increased fascial restricitons,Pain,Impaired UE functional use,Decreased balance,Decreased mobility,Difficulty walking,Increased muscle spasms,Improper body mechanics,Impaired perceived functional ability,Decreased range of motion,Postural dysfunction,Impaired flexibility  Visit Diagnosis: Acute bilateral low back pain with bilateral sciatica  Pain in thoracic spine  Cervicalgia  Acute pain of right shoulder  Acute pain of left shoulder  Intractable acute post-traumatic headache  Abnormal posture     Problem List Patient Active Problem List   Diagnosis Date Noted  . Moderate persistent asthma without complication 91/22/5834  . Impaired cognition 02/16/2020  . Restrictive lung disease 10/13/2019  . Chronic cough 10/13/2019  . Carotid artery stenosis 09/22/2019  . OSA (obstructive sleep apnea) 09/21/2019  . History of 2019 novel coronavirus disease (COVID-19) 09/21/2019  . Shortness of breath 09/21/2019  . Hematuria 04/09/2019  . Chronic nonintractable headache 04/09/2019  . Fecal occult blood test positive 03/12/2019  . Suspected sleep apnea 12/05/2017  . Snoring 08/24/2017  . IFG (impaired  fasting glucose) 09/28/2016  . Mixed hyperlipidemia 09/28/2016  . Dystrophic nail 09/28/2016  . Acute midline low back pain without sciatica 08/22/2016  . Spondylosis of cervical region without myelopathy or radiculopathy 08/22/2016  . Thyroid nodule 07/27/2016  . Chronic pain syndrome 02/20/2016  . Anxiety 11/16/2015  . Benign essential hypertension 09/14/2015  . Iron deficiency anemia due to chronic blood loss 06/16/2015  . Trigeminal neuralgia of left side of face 06/16/2015  . Allergic contact dermatitis 05/28/2015  . Hematoma - postoperative 05/16/2011  . Mass of shoulder, L scapular area  04/17/2011    Percival Spanish, PT, MPT 01/03/2021, 12:59 PM  [facility] [address]  [place], [place], [address] Phone: [phone]   Fax:  [phone]  Name: Amisadai Woodford MRN: [id number] Date of Birth: September 30, 1961

## 2021-01-06 ENCOUNTER — Other Ambulatory Visit: Payer: Self-pay | Admitting: Family Medicine

## 2021-01-06 ENCOUNTER — Ambulatory Visit: Payer: 59 | Admitting: Physical Therapy

## 2021-01-11 ENCOUNTER — Other Ambulatory Visit: Payer: Self-pay

## 2021-01-11 ENCOUNTER — Encounter: Payer: Self-pay | Admitting: Physical Therapy

## 2021-01-11 ENCOUNTER — Ambulatory Visit: Payer: 59 | Admitting: Physical Therapy

## 2021-01-11 DIAGNOSIS — M542 Cervicalgia: Secondary | ICD-10-CM

## 2021-01-11 DIAGNOSIS — M25511 Pain in right shoulder: Secondary | ICD-10-CM

## 2021-01-11 DIAGNOSIS — M546 Pain in thoracic spine: Secondary | ICD-10-CM

## 2021-01-11 DIAGNOSIS — R293 Abnormal posture: Secondary | ICD-10-CM

## 2021-01-11 DIAGNOSIS — G44311 Acute post-traumatic headache, intractable: Secondary | ICD-10-CM

## 2021-01-11 DIAGNOSIS — M25512 Pain in left shoulder: Secondary | ICD-10-CM

## 2021-01-11 DIAGNOSIS — M5442 Lumbago with sciatica, left side: Secondary | ICD-10-CM | POA: Diagnosis not present

## 2021-01-11 DIAGNOSIS — M5441 Lumbago with sciatica, right side: Secondary | ICD-10-CM

## 2021-01-11 NOTE — Therapy (Signed)
[facility] [address]  [address] [place], [place], [address] Phone: [phone]   Fax:  [phone]  Physical Therapy Treatment  Patient Details  Name: Shirley Hubbard MRN: [id number] Date of Birth: [birth year] Referring Provider (PT): Lavada Mesi, MD   Encounter Date: 01/11/2021   PT End of Session - 01/11/21 0931    Visit Number 24    Number of Visits 28    Date for PT Re-Evaluation 01/06/21    Authorization Type Aetna    PT Start Time [phone]    PT Stop Time 0929    PT Time Calculation (min) 38 min    Activity Tolerance Patient tolerated treatment well    Behavior During Therapy [facility] for tasks assessed/performed           Past Medical History:  Diagnosis Date  . Hyperlipidemia   . Hypertension    on medication but cant remember the name  . Lipoma   . Seroma    axillary    Past Surgical History:  Procedure Laterality Date  . ABDOMINAL HYSTERECTOMY  2000  . LIPOMA EXCISION     back  . seroma aspiration      There were no vitals filed for this visit.   Subjective Assessment - 01/11/21 0853    Subjective Feeling better after an ED visit d/t tooth pain. Notes that DN helped her a whole lot- pain is much better but not gone. Reports 50-60% improvement overall. notes that she still struggles with HAs and back pain.    Pertinent History HTN, HLD    Diagnostic tests 09/10/20 chest xray: negative; 09/10/20 head CT: negative    Patient Stated Goals decrease pain    Currently in Pain? Yes    Pain Score 3    Pain Location Head    Pain Orientation Right    Pain Descriptors / Indicators Aching    Pain Type Acute pain              [facility] PT Assessment - 01/11/21 0001      Assessment   Medical Diagnosis Neck pain, pain in thoracic spine, acute LBP, acute pain of R shoulder, pain in R/L leg    Referring Provider (PT) Lavada Mesi, MD    Onset Date/Surgical Date 09/10/20      AROM   Right Shoulder  Flexion 151 Degrees    Right Shoulder ABduction 109 Degrees    Right Shoulder Internal Rotation --   FIR lateral to T10   Right Shoulder External Rotation --   FER C7   Left Shoulder Flexion 134 Degrees    Left Shoulder ABduction 120 Degrees    Left Shoulder Internal Rotation --   FIR lateral to T9   Left Shoulder External Rotation --   FER T2   Cervical Flexion 47    Cervical Extension 35    Cervical - Right Side Bend 48    Cervical - Left Side Bend 35    Cervical - Right Rotation 40   pain   Cervical - Left Rotation 36    Lumbar Flexion distal shin   mild pain   Lumbar Extension moderately limited    Lumbar - Right Side Bend jt line    Lumbar - Left Side Bend proximal tibia    Lumbar - Right Rotation mildly limited    Lumbar - Left Rotation mild/moderately limited   pain     Strength   Right Shoulder Flexion 4+/5  Right Shoulder ABduction 4+/5    Right Shoulder Internal Rotation 4+/5    Right Shoulder External Rotation 4+/5    Left Shoulder Flexion 4+/5    Left Shoulder ABduction 4+/5    Left Shoulder Internal Rotation 4+/5    Left Shoulder External Rotation 4+/5    Right Wrist Flexion 4+/5    Right Wrist Extension 4+/5    Left Wrist Flexion 4+/5    Left Wrist Extension 4+/5    Right Hand Grip (lbs) 24.67   30, 20, 24   Left Hand Grip (lbs) 28   29, 30, 25   Right Hip Flexion 4+/5    Right Hip ABduction 4+/5    Right Hip ADduction 4+/5    Left Hip Flexion 4+/5    Left Hip ABduction 4+/5    Left Hip ADduction 4+/5    Right Knee Flexion 5/5    Right Knee Extension 5/5    Left Knee Flexion 5/5    Left Knee Extension 5/5    Right Ankle Dorsiflexion 4+/5    Right Ankle Plantar Flexion 4+/5    Left Ankle Dorsiflexion 4+/5    Left Ankle Plantar Flexion 4+/5                         [facility] Adult PT Treatment/Exercise - 01/11/21 0001      Therapeutic Activites    Therapeutic Activities Lifting    Lifting able to lift 10#, 15#, 20# box from floor with  c/o B knee pain but no LBP   good neutral spine but cues to widen stance     Lumbar Exercises: Aerobic   Nustep L5 x 5 min                  PT Education - 01/11/21 0930    Education Details discussion on objective progress and remaining impairments; encouraged pt to continue walking and try water aerobics to address continued fitness    Person(s) Educated Patient    Methods Explanation;Demonstration;Tactile cues;Verbal cues    Comprehension Verbalized understanding            PT Short Term Goals - 01/11/21 0855      PT SHORT TERM GOAL #1   Title Patient to be independent with initial HEP.    Status Achieved             PT Long Term Goals - 01/11/21 0856      PT LONG TERM GOAL #1   Title Patient to be independent with advanced HEP.    Time 8    Period Weeks    Status Achieved      PT LONG TERM GOAL #2   Title Patient to demonstrate cervical and lumbar AROM WFL and with mild pain remaining.    Time 8    Period Weeks    Status Partially Met   still limited, much improved pain     PT LONG TERM GOAL #3   Title Patient to demonstrate B shoulder AROM WFL and without pain limiting.    Time 8    Period Weeks    Status Partially Met   still limited in abduction and rotation; much improved     PT LONG TERM GOAL #4   Title Patient to demonstrate B UE and LE strength >/=4+/5.    Time 8    Period Weeks    Status Achieved   improved- see objective measures     PT  LONG TERM GOAL #5   Title Patient to report tolerance of 2 hours on her feet without pain limiting.    Time 8    Period Weeks    Status Partially Met   reports 1 hour     PT LONG TERM GOAL #6   Title Patient to demonstrate good body mechanics and no pain when lifting 20lbs box from floor to improve tolerance for liting at work.    Time 8    Period Weeks    Status Achieved   12/23/20 - able to lift 10# box from floor                Plan - 01/11/21 1025    Clinical Impression Statement Patient  arrived to session with report of 50-60% improvement overall. Notes that she still struggles with HAs and back pain, but notes good improvement in pain levels with DN. Reports that she is now able to tolerate 1 hour on her feet before increase in pain. Able to lift a 20lb box from the floor with good neutral spine and no c/o LBP. Patient has met UE and LE Strength goal. Cervical AROM has improved in flexion, extension, B SBing, and R rotation. Lumbar AROM has improved in flexion, L SBing, and R rotation. Pain levels much improved with ROM measurements. B shoulder AROM has improved in flexion, with some remaining limitation in abduction and IR/ER. Patient has demonstrated good improvement in pain levels, functional activity tolerance, and strength. Patient reports independence with HEP and plans to try water aerobics for continued fitness. Patient is now ready for 30 day hold at this time with transition to HEP.    Personal Factors and Comorbidities Age;Comorbidity 2;Fitness;Past/Current Experience;Profession;Time since onset of injury/illness/exacerbation;Transportation    Comorbidities HTN, HLD    Rehab Potential Good    PT Frequency 2x / week    PT Duration 8 weeks    PT Treatment/Interventions ADLs/Self Care Home Management;Cryotherapy;Electrical Stimulation;Iontophoresis 4mg /ml Dexamethasone;Moist Heat;Traction;Balance training;Therapeutic exercise;Therapeutic activities;Functional mobility training;Stair training;Gait training;Ultrasound;Neuromuscular re-education;Patient/family education;Canalith Repostioning;Manual techniques;Vestibular;Vasopneumatic Device;Taping;Energy conservation;Dry needling;Passive range of motion    PT Next Visit Plan 30 day hold at this time    PT Home Exercise Plan Access Code: [email or web address] ([email or web address]/1)    Consulted and Agree with Plan of Care Patient           Patient will benefit from skilled therapeutic intervention in order to improve the following deficits and impairments:   Hypomobility,Decreased endurance,Decreased activity tolerance,Decreased strength,Increased fascial restricitons,Pain,Impaired UE functional use,Decreased balance,Decreased mobility,Difficulty walking,Increased muscle spasms,Improper body mechanics,Impaired perceived functional ability,Decreased range of motion,Postural dysfunction,Impaired flexibility  Visit Diagnosis: Acute bilateral low back pain with bilateral sciatica  Pain in thoracic spine  Cervicalgia  Acute pain of right shoulder  Acute pain of left shoulder  Intractable acute post-traumatic headache  Abnormal posture     Problem List Patient Active Problem List   Diagnosis Date Noted  . Moderate persistent asthma without complication 29/52/8413  . Impaired cognition 02/16/2020  . Restrictive lung disease 10/13/2019  . Chronic cough 10/13/2019  . Carotid artery stenosis 09/22/2019  . OSA (obstructive sleep apnea) 09/21/2019  . History of 2019 novel coronavirus disease (COVID-19) 09/21/2019  . Shortness of breath 09/21/2019  . Hematuria 04/09/2019  . Chronic nonintractable headache 04/09/2019  . Fecal occult blood test positive 03/12/2019  . Suspected sleep apnea 12/05/2017  . Snoring 08/24/2017  . IFG (impaired fasting glucose) 09/28/2016  . Mixed hyperlipidemia 09/28/2016  . Dystrophic nail 09/28/2016  . Acute midline low  back pain without sciatica 08/22/2016  . Spondylosis of cervical region without myelopathy or radiculopathy 08/22/2016  . Thyroid nodule 07/27/2016  . Chronic pain syndrome 02/20/2016  . Anxiety 11/16/2015  . Benign essential hypertension 09/14/2015  . Iron deficiency anemia due to chronic blood loss 06/16/2015  . Trigeminal neuralgia of left side of face 06/16/2015  . Allergic contact dermatitis 05/28/2015  . Hematoma - postoperative 05/16/2011  . Mass of shoulder, L scapular area  04/17/2011     Janene Harvey, PT, DPT 01/11/21 10:28 AM   [facility] [address]  [place], [place], [address] Phone: [phone]   Fax:  [phone]  Name: Katya Rolston MRN: [id number] Date of Birth: Sep 03, 1961

## 2021-01-21 ENCOUNTER — Ambulatory Visit (INDEPENDENT_AMBULATORY_CARE_PROVIDER_SITE_OTHER): Payer: 59 | Admitting: Family Medicine

## 2021-01-21 ENCOUNTER — Other Ambulatory Visit: Payer: Self-pay

## 2021-01-21 ENCOUNTER — Encounter: Payer: Self-pay | Admitting: Family Medicine

## 2021-01-21 DIAGNOSIS — M546 Pain in thoracic spine: Secondary | ICD-10-CM

## 2021-01-21 DIAGNOSIS — M542 Cervicalgia: Secondary | ICD-10-CM | POA: Diagnosis not present

## 2021-01-21 DIAGNOSIS — M545 Low back pain, unspecified: Secondary | ICD-10-CM | POA: Diagnosis not present
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
## 2021-01-24 ENCOUNTER — Other Ambulatory Visit: Payer: Self-pay | Admitting: Family Medicine

## 2021-02-08 ENCOUNTER — Other Ambulatory Visit: Payer: Self-pay

## 2021-02-08 ENCOUNTER — Encounter: Payer: Self-pay | Admitting: Physical Therapy

## 2021-02-08 ENCOUNTER — Ambulatory Visit: Payer: 59 | Attending: Family Medicine | Admitting: Physical Therapy

## 2021-02-08 DIAGNOSIS — M546 Pain in thoracic spine: Secondary | ICD-10-CM

## 2021-02-08 DIAGNOSIS — R293 Abnormal posture: Secondary | ICD-10-CM

## 2021-02-08 DIAGNOSIS — M25512 Pain in left shoulder: Secondary | ICD-10-CM

## 2021-02-08 DIAGNOSIS — M542 Cervicalgia: Secondary | ICD-10-CM

## 2021-02-08 DIAGNOSIS — G44311 Acute post-traumatic headache, intractable: Secondary | ICD-10-CM | POA: Diagnosis present

## 2021-02-08 DIAGNOSIS — M25511 Pain in right shoulder: Secondary | ICD-10-CM | POA: Diagnosis present

## 2021-02-08 DIAGNOSIS — M5441 Lumbago with sciatica, right side: Secondary | ICD-10-CM

## 2021-02-08 DIAGNOSIS — M5442 Lumbago with sciatica, left side: Secondary | ICD-10-CM | POA: Insufficient documentation

## 2021-02-08 NOTE — Therapy (Addendum)
[facility] [address]  [address] [place], [place], [address] Phone: [phone]   Fax:  [phone]  Physical Therapy Treatment  Patient Details  Name: Shirley Hubbard MRN: [id number] Date of Birth: 08-04-1961 Referring Provider (PT): Lavada Mesi, MD   Encounter Date: 02/08/2021   PT End of Session - 02/08/21 1537    Visit Number 25    Number of Visits 28    Authorization Type [employer]    PT Start Time 1537   Pt arrived late   PT Stop Time 1630    PT Time Calculation (min) 53 min    Activity Tolerance Patient tolerated treatment well    Behavior During Therapy [facility] for tasks assessed/performed           Past Medical History:  Diagnosis Date  . Hyperlipidemia   . Hypertension    on medication but cant remember the name  . Lipoma   . Seroma    axillary    Past Surgical History:  Procedure Laterality Date  . ABDOMINAL HYSTERECTOMY  2000  . LIPOMA EXCISION     back  . seroma aspiration      There were no vitals filed for this visit.   Subjective Assessment - 02/08/21 1541    Subjective Pt reports a few days after her last PT session she noted that the muscles at the lower angle of her R shoulder blade started to knot back up. Her husband tried to work on the spot but unable to to get relief of the tightness.    Pertinent History HTN, HLD    Diagnostic tests 09/10/20 chest xray: negative; 09/10/20 head CT: negative    Patient Stated Goals decrease pain    Currently in Pain? Yes    Pain Score 3    up to 5-6/10   Pain Location Scapula    Pain Orientation Right;Lower;Lateral    Pain Descriptors / Indicators Dull;Tender   "nervy", "knot"   Pain Type Chronic pain    Pain Frequency Intermittent    Aggravating Factors  increased activity - nothing specific    Pain Relieving Factors husband massaging the spot; muscle relaxant & ibuprofen    Multiple Pain Sites No                              [facility] Adult PT Treatment/Exercise - 02/08/21 1537      Modalities   Modalities Electrical Stimulation;Moist Heat      Moist Heat Therapy   Number Minutes Moist Heat 15 Minutes    Moist Heat Location Shoulder   R periscapular & UT     Electrical Stimulation   Electrical Stimulation Location R thoracic paraspinals & periscapular muscles    Electrical Stimulation Action IFC    Electrical Stimulation Parameters 80-150hz ; output to tolerance; 15 min    Electrical Stimulation Goals Pain;Tone      Manual Therapy   Manual Therapy Soft tissue mobilization;Myofascial release    Manual therapy comments skilled palpation and monitoring during DN    Soft tissue mobilization STM to R periscapular muscles & UT - most TTP over R medial scapula    Myofascial Release manual TPR to R rhomboids, lower traps, subscapularis, teres group, lats & UT- decreased TTP following DN            Trigger Point Dry Needling - 02/08/21 1537    Consent Given? Yes  Muscles Treated Head and Neck Upper trapezius   Right   Muscles Treated Upper Quadrant Rhomboids;Subscapularis;Latissimus dorsi;Teres major;Teres minor;Lower trapezius   Right   Rhomboids Response Twitch response elicited;Palpable increased muscle length    Infraspinatus Response Twitch response elicited;Palpable increased muscle length    Subscapularis Response Twitch response elicited;Palpable increased muscle length    Latissimus dorsi Response Twitch response elicited;Palpable increased muscle length    Teres major Response Twitch response elicited;Palpable increased muscle length    Teres minor Response Twitch response elicited;Palpable increased muscle length    Lower trapezius Response Twitch response elicited;Palpable increased muscle length                  PT Short Term Goals - 01/11/21 0855      PT SHORT TERM GOAL #1   Title Patient to be independent with initial HEP.    Status Achieved              PT Long Term Goals - 01/11/21 0856      PT LONG TERM GOAL #1   Title Patient to be independent with advanced HEP.    Time 8    Period Weeks    Status Achieved      PT LONG TERM GOAL #2   Title Patient to demonstrate cervical and lumbar AROM WFL and with mild pain remaining.    Time 8    Period Weeks    Status Partially Met   still limited, much improved pain     PT LONG TERM GOAL #3   Title Patient to demonstrate B shoulder AROM WFL and without pain limiting.    Time 8    Period Weeks    Status Partially Met   still limited in abduction and rotation; much improved     PT LONG TERM GOAL #4   Title Patient to demonstrate B UE and LE strength >/=4+/5.    Time 8    Period Weeks    Status Achieved   improved- see objective measures     PT LONG TERM GOAL #5   Title Patient to report tolerance of 2 hours on her feet without pain limiting.    Time 8    Period Weeks    Status Partially Met   reports 1 hour     PT LONG TERM GOAL #6   Title Patient to demonstrate good body mechanics and no pain when lifting 20lbs box from floor to improve tolerance for liting at work.    Time 8    Period Weeks    Status Achieved   12/23/20 - able to lift 10# box from floor                Plan - 02/08/21 1630    Clinical Impression Statement Latrice returning within 30-day hold window requesting DN for R periscapular muscle "knots" for which she has been unable to get relief with HEP or her husband attempting to massage the area. Increased muscle tension and TTP evident in R lower rhomboids, lower traps, subscapularis, teres group, lats and UT - addressed all muscles with MT incorporating DN with decreased muscle tension and TTP noted following treatment. Session concluded with estim and moist heat to promote further muscle relaxation. Pt aware that she will need full reassessment and recertification with her primary PT if she would need to return to PT again.    Personal Factors  and Comorbidities Age;Comorbidity 2;Fitness;Past/Current Experience;Profession;Time since onset of injury/illness/exacerbation;Transportation    Comorbidities  HTN, HLD    Rehab Potential Good    PT Frequency 2x / week    PT Duration 8 weeks    PT Treatment/Interventions ADLs/Self Care Home Management;Cryotherapy;Electrical Stimulation;Iontophoresis 4mg /ml Dexamethasone;Moist Heat;Traction;Balance training;Therapeutic exercise;Therapeutic activities;Functional mobility training;Stair training;Gait training;Ultrasound;Neuromuscular re-education;Patient/family education;Canalith Repostioning;Manual techniques;Vestibular;Vasopneumatic Device;Taping;Energy conservation;Dry needling;Passive range of motion    PT Next Visit Plan Recert necessary if pt would need to return to PT    PT Home Exercise Plan Access Code: [email or web address] ([email or web address])    Consulted and Agree with Plan of Care Patient           Patient will benefit from skilled therapeutic intervention in order to improve the following deficits and impairments:  Hypomobility,Decreased endurance,Decreased activity tolerance,Decreased strength,Increased fascial restricitons,Pain,Impaired UE functional use,Decreased balance,Decreased mobility,Difficulty walking,Increased muscle spasms,Improper body mechanics,Impaired perceived functional ability,Decreased range of motion,Postural dysfunction,Impaired flexibility  Visit Diagnosis: Acute bilateral low back pain with bilateral sciatica  Pain in thoracic spine  Cervicalgia  Acute pain of right shoulder  Acute pain of left shoulder  Intractable acute post-traumatic headache  Abnormal posture     Problem List Patient Active Problem List   Diagnosis Date Noted  . Moderate persistent asthma without complication 97/53/0051  . Impaired cognition 02/16/2020  . Restrictive lung disease 10/13/2019  . Chronic cough 10/13/2019  . Carotid artery stenosis 09/22/2019  . OSA (obstructive sleep apnea)  09/21/2019  . History of 2019 novel coronavirus disease (COVID-19) 09/21/2019  . Shortness of breath 09/21/2019  . Hematuria 04/09/2019  . Chronic nonintractable headache 04/09/2019  . Fecal occult blood test positive 03/12/2019  . Suspected sleep apnea 12/05/2017  . Snoring 08/24/2017  . IFG (impaired fasting glucose) 09/28/2016  . Mixed hyperlipidemia 09/28/2016  . Dystrophic nail 09/28/2016  . Acute midline low back pain without sciatica 08/22/2016  . Spondylosis of cervical region without myelopathy or radiculopathy 08/22/2016  . Thyroid nodule 07/27/2016  . Chronic pain syndrome 02/20/2016  . Anxiety 11/16/2015  . Benign essential hypertension 09/14/2015  . Iron deficiency anemia due to chronic blood loss 06/16/2015  . Trigeminal neuralgia of left side of face 06/16/2015  . Allergic contact dermatitis 05/28/2015  . Hematoma - postoperative 05/16/2011  . Mass of shoulder, L scapular area  04/17/2011    Percival Spanish, PT, MPT 02/08/2021, 4:52 PM  [facility] [address]  [place], [place], [address] Phone: [phone]   Fax:  [phone]  Name: Shye Doty MRN: [id number] Date of Birth: 1961-05-04  PHYSICAL THERAPY DISCHARGE SUMMARY  Visits from Start of Care: 25  Current functional level related to goals / functional outcomes: Unable to assess; patient did not return   Remaining deficits: Unable to assess   Education / Equipment: HEP  Plan: Patient agrees to discharge.  Patient goals were partially met. Patient is being discharged due to not returning since the last visit.  ?????     Janene Harvey, PT, DPT 03/10/21 3:36 PM

## 2021-02-16 ENCOUNTER — Encounter: Payer: Self-pay | Admitting: Family Medicine

## 2021-02-16 ENCOUNTER — Other Ambulatory Visit: Payer: Self-pay

## 2021-02-16 ENCOUNTER — Ambulatory Visit (INDEPENDENT_AMBULATORY_CARE_PROVIDER_SITE_OTHER): Payer: 59 | Admitting: Family Medicine

## 2021-02-16 DIAGNOSIS — M79604 Pain in right leg: Secondary | ICD-10-CM | POA: Diagnosis not present

## 2021-02-16 DIAGNOSIS — M542 Cervicalgia: Secondary | ICD-10-CM
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
## 2021-02-22 ENCOUNTER — Other Ambulatory Visit: Payer: Self-pay | Admitting: Family Medicine

## 2021-02-26 ENCOUNTER — Other Ambulatory Visit: Payer: 59

## 2021-02-28 ENCOUNTER — Telehealth: Payer: Self-pay

## 2021-02-28 ENCOUNTER — Other Ambulatory Visit: Payer: 59

## 2021-02-28 NOTE — Telephone Encounter (Signed)
Csp and right knee. [facility] is out-of-network.

## 2021-02-28 NOTE — Telephone Encounter (Signed)
patient called she is requesting a referral for mri to be sent to [facility] call [phone]

## 2021-03-02 NOTE — Telephone Encounter (Signed)
No pa required per [employer] with [place] [facility], sent secure message to [facility] with [employer] advising of orders

## 2021-03-12 ENCOUNTER — Ambulatory Visit (HOSPITAL_BASED_OUTPATIENT_CLINIC_OR_DEPARTMENT_OTHER)
Admission: RE | Admit: 2021-03-12 | Discharge: 2021-03-12 | Disposition: A | Discharge status: Home / Self Care | Payer: 59 | Source: Ambulatory Visit | Attending: Family Medicine | Admitting: Family Medicine

## 2021-03-12 ENCOUNTER — Other Ambulatory Visit: Payer: Self-pay

## 2021-03-12 DIAGNOSIS — M542 Cervicalgia: Secondary | ICD-10-CM

## 2021-03-12 DIAGNOSIS — M79604 Pain in right leg: Secondary | ICD-10-CM | POA: Diagnosis present

## 2021-03-12 IMAGING — MR MR CERVICAL SPINE W/O CM
4 of 6 series · 21 of 48 positions shown · non-contrast
Comparison: None.

CLINICAL DATA: Neck pain.

EXAM:
MRI CERVICAL SPINE WITHOUT CONTRAST
TECHNIQUE: Multiplanar, multisequence MR imaging of the cervical spine was
performed. No intravenous contrast was administered.

[Series 3: STIR · sagittal · 3.0mm · 0.82mm/px · 3 of 15 slices shown]
[im 3/15]
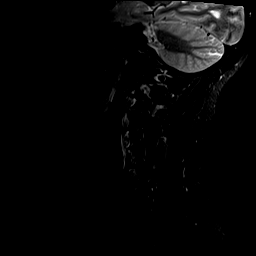
[im 9/15]
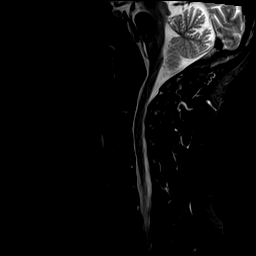
[im 15/15]
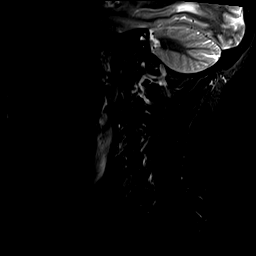

[Series 4: FLAIR · sagittal · 3.0mm · 0.41mm/px · 3 of 15 slices shown]
[im 3/15]
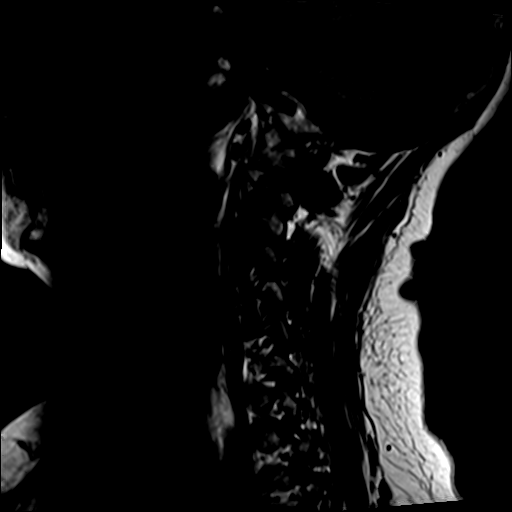
[im 9/15]
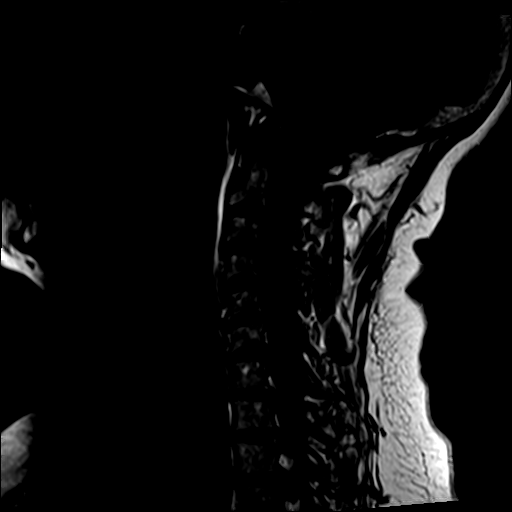
[im 15/15]
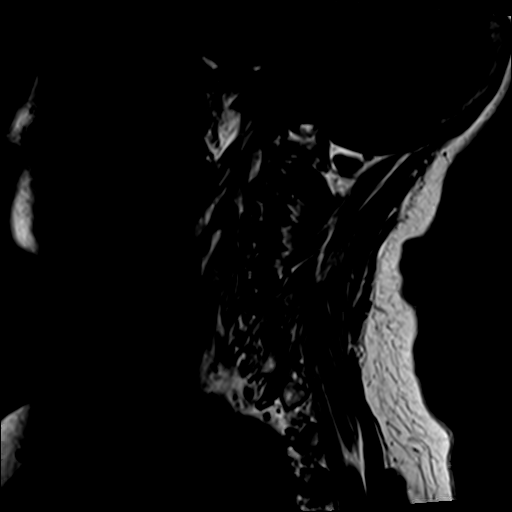

[Series 5: T2 · axial · 3.0mm · 0.38mm/px · z∈[-65,+54]mm · 9 of 36 slices shown (1 of 2)]
[im 1/36]
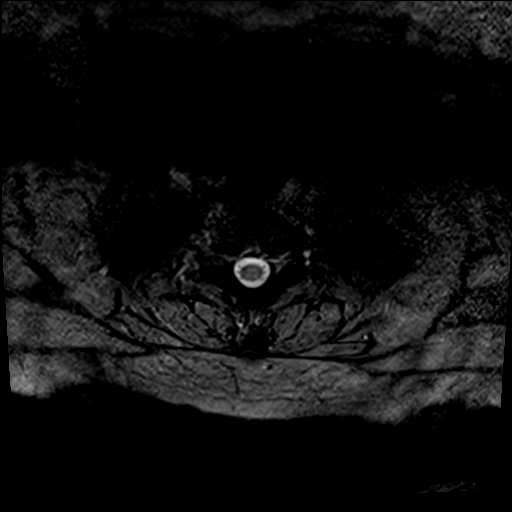
[im 6/36]
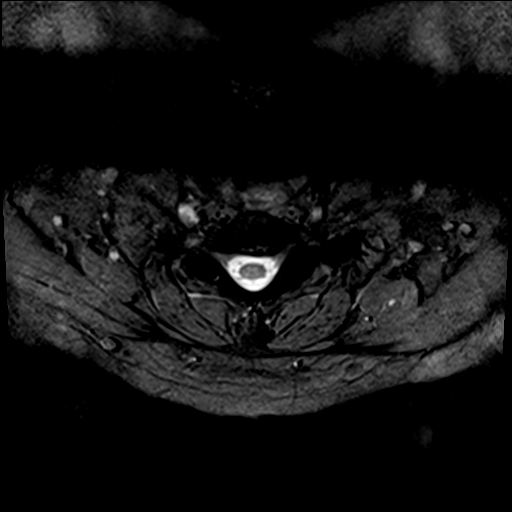
[im 12/36]
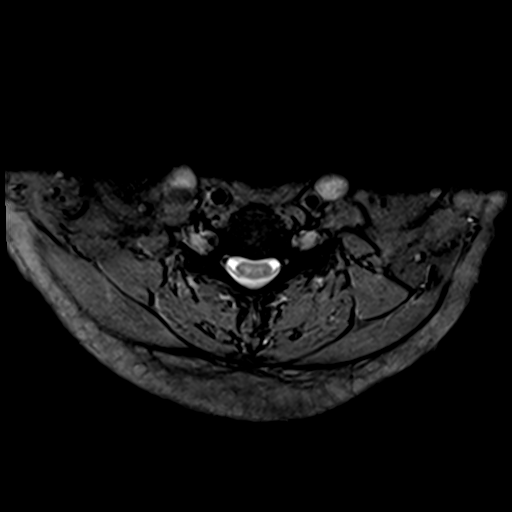
[im 15/36]
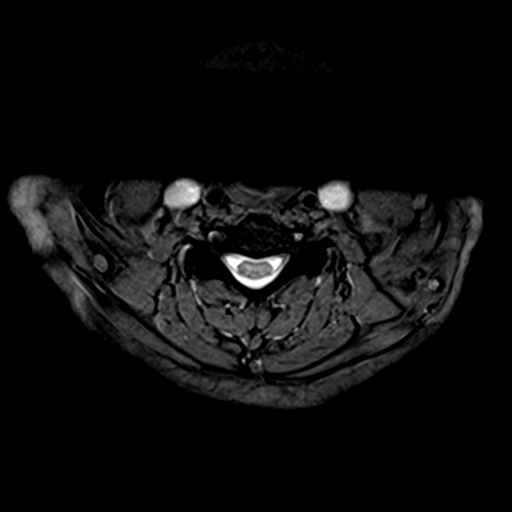
[im 18/36]
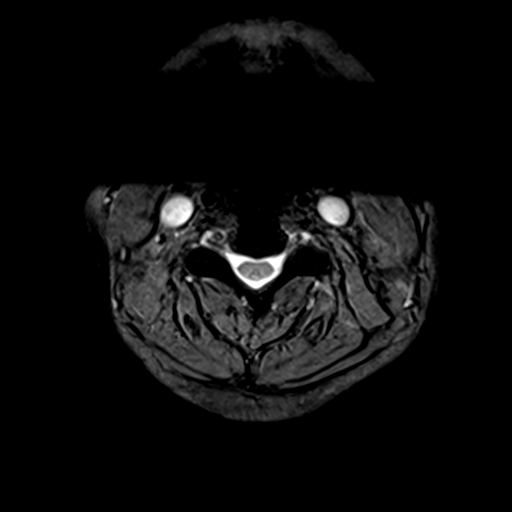
[im 21/36]
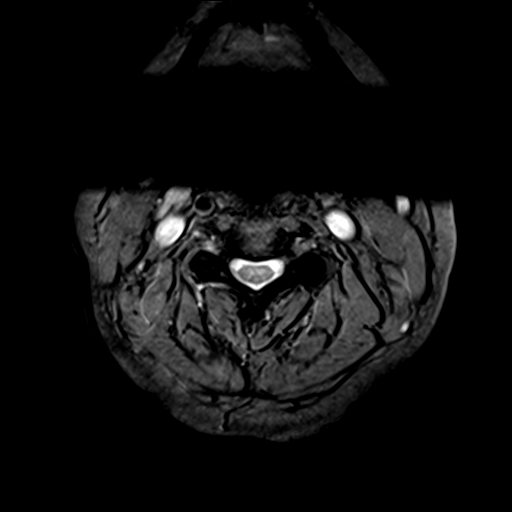
[im 24/36]
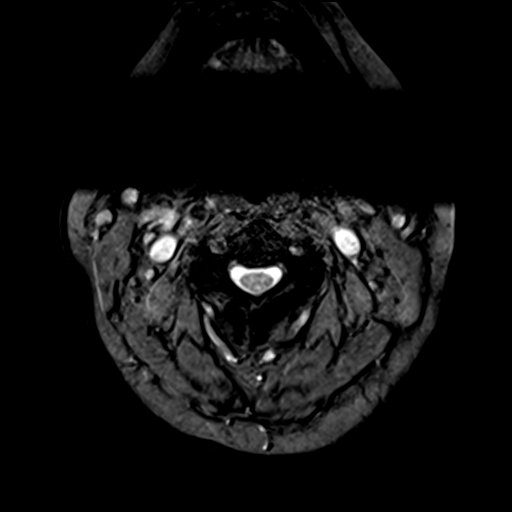
[im 30/36]
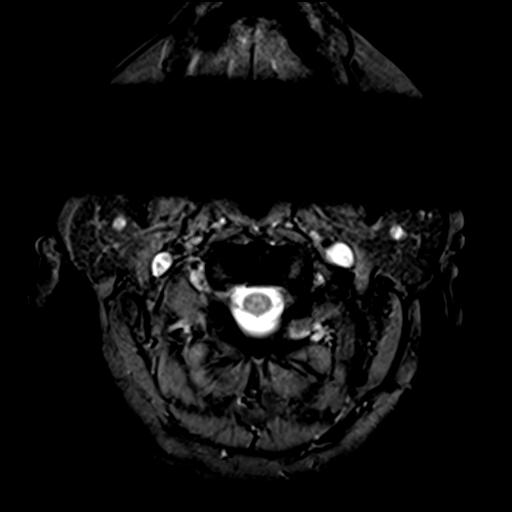
[im 36/36]
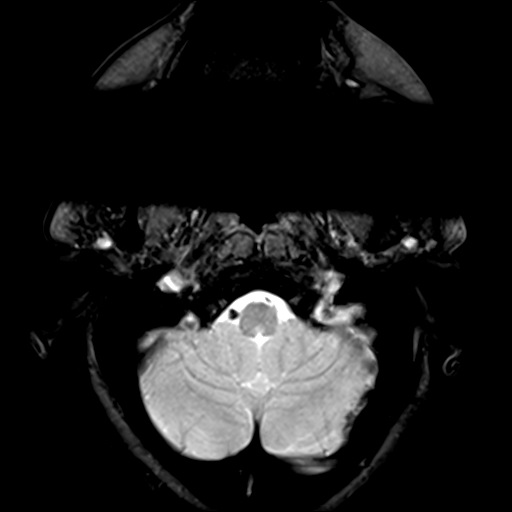

[Series 6: T2 · axial · 3.0mm · 0.35mm/px · z∈[-63,+35]mm · 6 of 36 slices shown (2 of 2)]
[im 1/36]
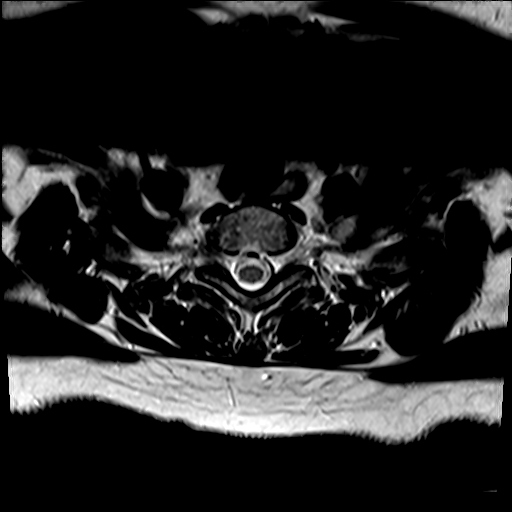
[im 6/36]
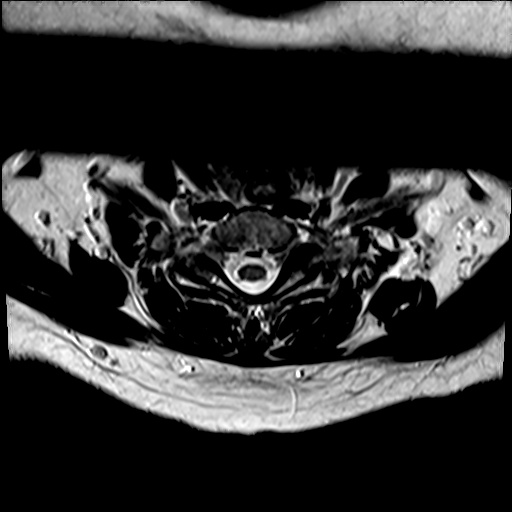
[im 12/36]
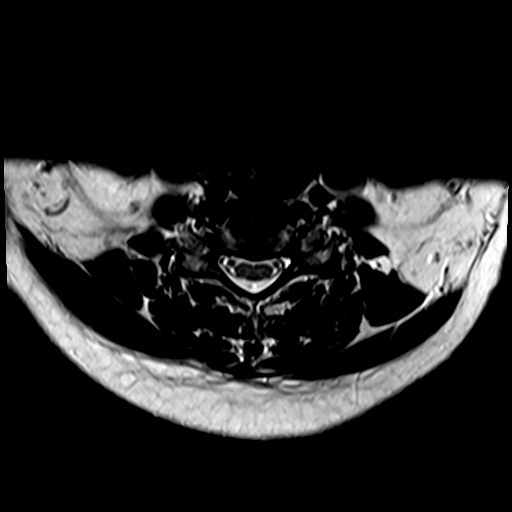
[im 15/36]
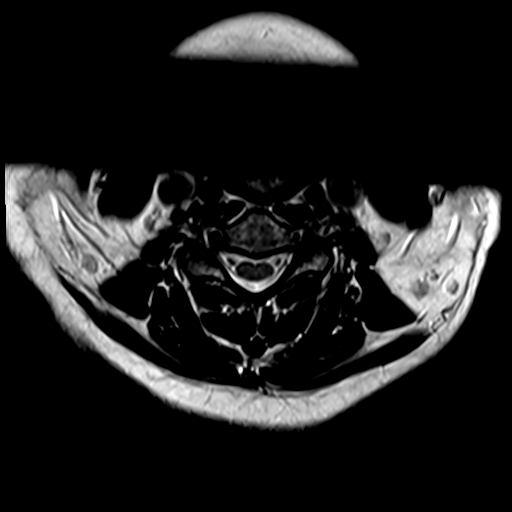
[im 18/36]
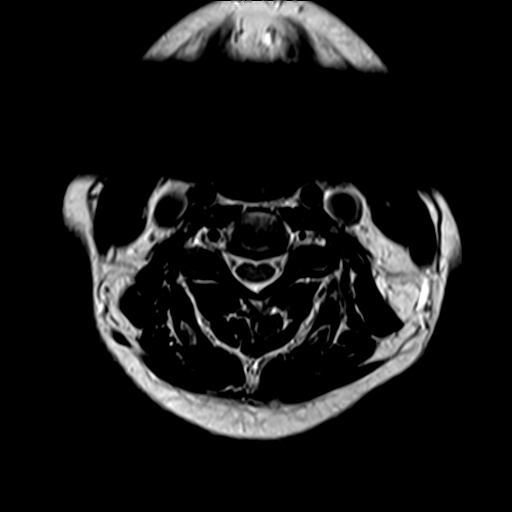
[im 30/36]
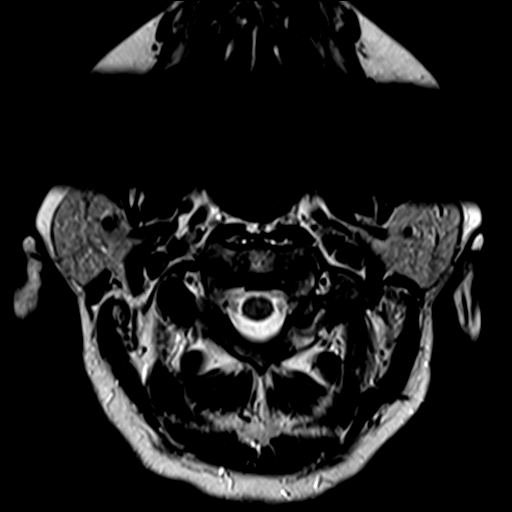

[21 of 48 positions shown; findings below may reference images not displayed]

FINDINGS: Alignment: Straightening of the normal cervical lordosis.

Vertebrae: Vertebral body heights are maintained. No specific
evidence of acute fracture or discitis/osteomyelitis.

Cord: Normal cord signal.

Posterior Fossa, vertebral arteries, paraspinal tissues: Visualized
vertebral artery flow voids are maintained. Small left vertebral
artery, likely non dominant. Visualized posterior fossa is
unremarkable on limited sagittal assessment.

Disc levels:

C2-C3: No significant disc protrusion, foraminal stenosis, or canal
stenosis.

C3-C4: Small posterior disc osteophyte complex and mild bilateral
facet and uncovertebral hypertrophy without significant canal or
foraminal stenosis.

C4-C5: Small posterior disc osteophyte complex and mild bilateral
facet and uncovertebral hypertrophy without significant canal or
foraminal stenosis.

C5-C6: Small posterior disc osteophyte complex and mild bilateral
facet and uncovertebral hypertrophy without significant canal or
foraminal stenosis.

C6-C7: No significant disc protrusion, foraminal stenosis, or canal
stenosis.

C7-T1: Small posterior disc osteophyte complex and left facet
hypertrophy. Mild left foraminal stenosis. No significant canal or
right foraminal stenosis.

No significant canal stenosis in the visualized upper thoracic
spine.
IMPRESSION: Mild multilevel degenerative change, detailed above and including
mild left foraminal stenosis at C7-T1. Otherwise, no significant
canal or foraminal stenosis.
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
## 2021-03-14 ENCOUNTER — Telehealth: Payer: Self-pay | Admitting: Family Medicine

## 2021-03-14 NOTE — Telephone Encounter (Signed)
MRI shows some degenerative changes in the neck, but no nerve impingement.  No indication for surgery.

## 2021-03-15 ENCOUNTER — Telehealth: Payer: Self-pay | Admitting: Family Medicine

## 2021-03-15 NOTE — Telephone Encounter (Signed)
MRI of the right knee is notable for a significant amount of arthritis behind the kneecap.  There is also a cyst within the bone of the tibia bone at the joint space.  These typically occur as a result of arthritis.  No definite indication for surgery at this point.  Meniscus cartilage and ligaments do not appear to be torn.  If pain level is still high enough, could consider an injection in the knee.

## 2021-03-17 NOTE — Telephone Encounter (Signed)
Left message on the patient's voice mail, alerting her of the messages in [place] from Dr. Junius Roads on her csp MRI and her knee MRI. Advised her to call back if she is having difficulty getting into her MyChart account to read these --- I can go over the results with her.

## 2021-03-20 ENCOUNTER — Other Ambulatory Visit: Payer: Self-pay | Admitting: Family Medicine

## 2021-03-22 ENCOUNTER — Other Ambulatory Visit: Payer: Self-pay | Admitting: Family Medicine

## 2021-03-22 ENCOUNTER — Telehealth: Payer: Self-pay | Admitting: Family Medicine

## 2021-03-22 ENCOUNTER — Telehealth: Payer: Self-pay

## 2021-03-22 DIAGNOSIS — M546 Pain in thoracic spine: Secondary | ICD-10-CM

## 2021-03-22 DIAGNOSIS — M542 Cervicalgia: Secondary | ICD-10-CM

## 2021-03-22 NOTE — Telephone Encounter (Signed)
The patient called back for me to go over her MRI results of the knee and csp (she cannot get into her MyChart). She says her "everyday pain" is just under her shoulder blades - she asks if she should have an MRI of the Tsp also, to check out that area. Please advise.

## 2021-03-22 NOTE — Telephone Encounter (Signed)
MRI ordered

## 2021-03-22 NOTE — Telephone Encounter (Signed)
I left voice mail, advising patient this has been ordered.

## 2021-03-29 NOTE — Addendum Note (Signed)
Addended by: Daylene Posey T on: 03/29/2021 11:27 AM   Modules accepted: Orders

## 2021-04-02 ENCOUNTER — Ambulatory Visit (HOSPITAL_BASED_OUTPATIENT_CLINIC_OR_DEPARTMENT_OTHER)
Admission: RE | Admit: 2021-04-02 | Discharge: 2021-04-02 | Disposition: A | Discharge status: Home / Self Care | Payer: 59 | Source: Ambulatory Visit | Attending: Family Medicine | Admitting: Family Medicine

## 2021-04-02 ENCOUNTER — Other Ambulatory Visit: Payer: Self-pay

## 2021-04-02 DIAGNOSIS — M542 Cervicalgia: Secondary | ICD-10-CM

## 2021-04-02 DIAGNOSIS — M546 Pain in thoracic spine: Secondary | ICD-10-CM | POA: Diagnosis present
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
## 2021-04-04 ENCOUNTER — Telehealth: Payer: Self-pay | Admitting: Family Medicine

## 2021-04-04 NOTE — Telephone Encounter (Signed)
Thoracic MRI scan shows some mild degenerative changes, but no ruptured disks and no pinched nerves.  No indication for surgery.

## 2021-04-04 NOTE — Telephone Encounter (Signed)
I called and left this info on her personal mobile voice mail, as well. Advised her to call if any questions/concerns.

## 2021-04-08 ENCOUNTER — Other Ambulatory Visit: Payer: Self-pay | Admitting: Family Medicine

## 2021-04-16 ENCOUNTER — Other Ambulatory Visit: Payer: Self-pay | Admitting: Family Medicine

## 2021-05-18 ENCOUNTER — Telehealth: Payer: Self-pay

## 2021-05-18 NOTE — Telephone Encounter (Signed)
Pts husband called into the office asking for a call back from terri and to advise that pt has tested positive for covid again

## 2021-05-18 NOTE — Telephone Encounter (Signed)
I called the patient. She said the urgent care provider is sending in something to her pharmacy, now that the PCR test came back positive. Please see her husband's chart Phylliss Blakes) for more on him -- message sent to Dr. Junius Roads.

## 2021-05-23 ENCOUNTER — Other Ambulatory Visit: Payer: Self-pay | Admitting: Family Medicine

## 2021-06-07 ENCOUNTER — Encounter: Payer: Self-pay | Admitting: Family Medicine

## 2021-06-07 ENCOUNTER — Ambulatory Visit (INDEPENDENT_AMBULATORY_CARE_PROVIDER_SITE_OTHER): Payer: 59 | Admitting: Family Medicine

## 2021-06-07 ENCOUNTER — Other Ambulatory Visit: Payer: Self-pay

## 2021-06-07 VITALS — BP 104/69 | HR 97 | Resp 20 | Ht 63.0 in | Wt 206.0 lb

## 2021-06-07 DIAGNOSIS — R0600 Dyspnea, unspecified: Secondary | ICD-10-CM

## 2021-06-07 DIAGNOSIS — R5383 Other fatigue: Secondary | ICD-10-CM

## 2021-06-07 MED ORDER — FLUTICASONE-SALMETEROL 100-50 MCG/ACT IN AEPB: 1.0000 | INHALATION_SPRAY | Freq: Two times a day (BID) | RESPIRATORY_TRACT | 3 refills | Status: AC

## 2021-06-07 MED ORDER — OMEGA-3-ACID ETHYL ESTERS 1 G PO CAPS: 4.0000 g | ORAL_CAPSULE | Freq: Every day | ORAL | 3 refills | Status: AC

## 2021-06-07 NOTE — Progress Notes (Signed)
Office Visit Note   Patient: Shirley Hubbard           Date of Birth: 1961/06/10           MRN: [id number] Visit Date: 06/07/2021 Requested by: Eunice Blase, MD [address] [place],  [place] [address] PCP: Eunice Blase, MD  Subjective: Chief Complaint  Patient presents with   Other    Follow up from covid: still fatigued, "my breathing is off" and still having headaches    HPI: Here with shortness of breath and fatigue.  She developed COVID for the second time about 2 to 3 weeks ago.  She was treated with Paxlovid which did not help.  She was then put on prednisone and albuterol.  She does not feel like she is made any progress.  She feels short of breath, feels like she is wheezing.  No further fevers or chills.  She does have a headache.                ROS:   All other systems were reviewed and are negative.  Objective: Vital Signs: BP 104/69   Pulse 97   Resp 20   Ht '5\' 3"'$  (1.6 m)   Wt 206 lb (93.4 kg)   SpO2 98%   BMI 36.49 kg/m   Physical Exam:  General:  Alert and oriented, in no acute distress. Pulm:  Breathing unlabored. Psy:  Normal mood, congruent affect  Neck: No lymphadenopathy. CV: Regular rate and rhythm without murmurs, rubs, or gallops.  No peripheral edema.  2+ radial and posterior tibial pulses. Lungs: Clear to auscultation throughout with no wheezing or areas of consolidation.    Imaging: No results found.  Assessment & Plan: Status post COVID-19 illness.  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 -We will treat with Advair for the next couple months.  Lovaza as well. -Supplements recommended.  Could contemplate low-dose naltrexone as well.     Procedures: No procedures performed        PMFS History: Patient Active Problem List   Diagnosis Date Noted   Moderate persistent asthma without complication 0000000   Impaired cognition 02/16/2020   Restrictive lung disease 10/13/2019   Chronic  cough 10/13/2019   Carotid artery stenosis 09/22/2019   OSA (obstructive sleep apnea) 09/21/2019   History of 2019 novel coronavirus disease (COVID-19) 09/21/2019   Shortness of breath 09/21/2019   Hematuria 04/09/2019   Chronic nonintractable headache 04/09/2019   Fecal occult blood test positive 03/12/2019   Suspected sleep apnea 12/05/2017   Snoring 08/24/2017   IFG (impaired fasting glucose) 09/28/2016   Mixed hyperlipidemia 09/28/2016   Dystrophic nail 09/28/2016   Acute midline low back pain without sciatica 08/22/2016   Spondylosis of cervical region without myelopathy or radiculopathy 08/22/2016   Thyroid nodule 07/27/2016   Chronic pain syndrome 02/20/2016   Anxiety 11/16/2015   Benign essential hypertension 09/14/2015   Iron deficiency anemia due to chronic blood loss 06/16/2015   Trigeminal neuralgia of left side of face 06/16/2015   Allergic contact dermatitis 05/28/2015   Hematoma - postoperative 05/16/2011   Mass of shoulder, L scapular area  04/17/2011   Past Medical History:  Diagnosis Date   Hyperlipidemia    Hypertension    on medication but cant remember the name   Lipoma    Seroma    axillary    Family History  Problem Relation Age of Onset   Healthy Sister    Healthy  Brother    Healthy Sister    Healthy Sister    Healthy Brother    Healthy Brother    Healthy Sister    Healthy Brother     Past Surgical History:  Procedure Laterality Date   ABDOMINAL HYSTERECTOMY  2000   LIPOMA EXCISION     back   seroma aspiration     Social History   Occupational History   Not on file  Tobacco Use   Smoking status: Never   Smokeless tobacco: Never  Substance and Sexual Activity   Alcohol use: No   Drug use: No   Sexual activity: Not on file

## 2021-06-07 NOTE — Patient Instructions (Addendum)
  Supplement regimen for covid:  Vitamin D3:  5,000 IU daily  Melatonin:  8 mg at bed time (extended-release version is best)  Turmeric:  500 mg twice daily  Quercetin:  250 mg twice daily  Zinc:  100 mg daily  Vitamin C:  1,000 mg twice daily   Another consideration is low-dose naltrexone (LDN).  This would not be covered by insurance, and would be obtained from a compounding pharmacy.  Let me know if you want to try this.

## 2021-06-13 ENCOUNTER — Telehealth: Payer: Self-pay | Admitting: Family Medicine

## 2021-06-13 NOTE — Telephone Encounter (Signed)
Patient called needing a return to work note. Patient said she would like to return to work 06/27/2021 without restrictions. The number to contact patient is  [phone]

## 2021-06-13 NOTE — Telephone Encounter (Signed)
Please advise 

## 2021-06-13 NOTE — Telephone Encounter (Signed)
[employer] forms received. To [employer]. 

## 2021-06-13 NOTE — Telephone Encounter (Signed)
Advised the patient her note is ready -- her husband, Elenore Rota, will come to pick this up.

## 2022-03-06 NOTE — Telephone Encounter (Signed)
err
